# Patient Record
Sex: Female | Born: 1985 | Hispanic: Yes | Marital: Married | Smoking: Never smoker
Health system: Southern US, Community
[De-identification: ages and names within clinical notes are randomized; demographics above are authoritative.]

## PROBLEM LIST (undated history)

## (undated) DIAGNOSIS — D649 Anemia, unspecified: Secondary | ICD-10-CM

## (undated) HISTORY — PX: OOPHORECTOMY: SHX86

---

## 2009-04-25 ENCOUNTER — Inpatient Hospital Stay (HOSPITAL_COMMUNITY): Admission: AD | Admit: 2009-04-25 | Discharge: 2009-04-25 | Payer: Self-pay | Admitting: Obstetrics & Gynecology

## 2009-04-28 ENCOUNTER — Ambulatory Visit (HOSPITAL_COMMUNITY): Admission: RE | Admit: 2009-04-28 | Discharge: 2009-04-28 | Payer: Self-pay | Admitting: Obstetrics & Gynecology

## 2009-04-28 HISTORY — PX: DILATION AND CURETTAGE OF UTERUS: SHX78

## 2009-12-12 ENCOUNTER — Ambulatory Visit (HOSPITAL_COMMUNITY)
Admission: RE | Admit: 2009-12-12 | Discharge: 2009-12-12 | Payer: Self-pay | Source: Home / Self Care | Admitting: Obstetrics

## 2010-01-21 NOTE — L&D Delivery Note (Signed)
Delivery Note At 1:00 PM a viable female was delivered via Vaginal, Spontaneous Delivery (vertex - : ;  ).  APGAR: 8, 9; weight 8 lb 2.9 oz (3711 g).   Placenta status: Intact, Spontaneous.  Cord: 3 vessels with the following complications: .  Cord pH: none  Anesthesia: Epidural Local :  1% Xylocaine Episiotomy: Yes Lacerations: 2nd degree Suture Repair: 2.0 chromic Est. Blood Loss (mL): 350  Mom to postpartum.  Baby to nursery-stable.  Janiyha Montufar A 09/01/2010, 1:40 PM

## 2010-02-28 LAB — HIV ANTIBODY (ROUTINE TESTING W REFLEX): HIV: NONREACTIVE

## 2010-02-28 LAB — ABO/RH: RH Type: POSITIVE

## 2010-04-11 LAB — CBC
MCHC: 34.5 g/dL (ref 30.0–36.0)
Platelets: 147 10*3/uL — ABNORMAL LOW (ref 150–400)
RDW: 13.3 % (ref 11.5–15.5)

## 2010-04-11 LAB — ABO/RH: ABO/RH(D): A POS

## 2010-05-03 LAB — RPR: RPR: NONREACTIVE

## 2010-07-31 LAB — STREP B DNA PROBE: GBS: NEGATIVE

## 2010-08-20 ENCOUNTER — Encounter (HOSPITAL_COMMUNITY): Payer: Self-pay | Admitting: *Deleted

## 2010-08-20 ENCOUNTER — Inpatient Hospital Stay (HOSPITAL_COMMUNITY)
Admission: AD | Admit: 2010-08-20 | Discharge: 2010-08-20 | Disposition: A | Discharge status: Home / Self Care | Payer: Medicaid Other | Source: Ambulatory Visit | Attending: Obstetrics | Admitting: Obstetrics

## 2010-08-20 ENCOUNTER — Inpatient Hospital Stay (HOSPITAL_COMMUNITY): Payer: Medicaid Other

## 2010-08-20 DIAGNOSIS — O479 False labor, unspecified: Secondary | ICD-10-CM | POA: Insufficient documentation

## 2010-08-20 NOTE — Progress Notes (Signed)
Pt states ctx's began last pm, denies bleeding or leaking of fluid, has mucousy vaginal d/c x2 days. +Fm. Pt states frequent ctx's. Unsure of last cervical exam results.

## 2010-08-20 NOTE — ED Notes (Signed)
Pt given sprite, call light in reach.

## 2010-08-20 NOTE — Progress Notes (Signed)
Contracting all night, no bleeding or leaking. G 2 1 SAB

## 2010-08-27 ENCOUNTER — Inpatient Hospital Stay (HOSPITAL_COMMUNITY)
Admission: AD | Admit: 2010-08-27 | Discharge: 2010-08-28 | Disposition: A | Discharge status: Home / Self Care | Payer: Medicaid Other | Source: Ambulatory Visit | Attending: Obstetrics & Gynecology | Admitting: Obstetrics & Gynecology

## 2010-08-27 DIAGNOSIS — O479 False labor, unspecified: Secondary | ICD-10-CM | POA: Insufficient documentation

## 2010-08-28 NOTE — Discharge Instructions (Signed)
False Labor (Braxton Hicks Contractions) °Pregnancy is commonly associated with contractions of the uterus throughout the pregnancy. Towards the end of pregnancy (32-34 weeks), these contractions (Braxton Hicks) can develop more often and may become more forceful. This is not true labor because these contractions do not result in opening (dilatation) and thinning of the cervix. They are sometimes difficult to tell apart from true labor because these contractions can be forceful and people have different pain tolerances. You should not feel embarrassed if you go to the hospital with false labor. Sometimes, the only way to tell if you are in true labor is for your caregiver to follow the changes in the cervix. °HOW TO TELL THE DIFFERENCE BETWEEN TRUE AND FALSE LABOR °· False labor.  °· The contractions of false labor are usually shorter, irregular and not as hard as those of true labor.  °· They are often felt in the front of the lower abdomen and in the groin.  °· They may leave with walking around or changing positions while lying down.  °· They get weaker and are shorter lasting as time goes on.  °· These contractions are usually irregular.  °· They do not usually become progressively stronger, regular and closer together as with true labor.  °· True labor.  °· Contractions in true labor last 30 to 70 seconds, become very regular, usually become more intense, and increase in frequency.  °· They do not go away with walking.  °· The discomfort is usually felt in the top of the uterus and spreads to the lower abdomen and low back.  °· True labor can be determined by your caregiver with an exam. This will show that the cervix is dilating and getting thinner.  °If there are no prenatal problems or other health problems associated with the pregnancy, it is completely safe to be sent home with false labor and await the onset of true labor. °HOME CARE INSTRUCTIONS °· Keep up with your usual exercises and instructions.   °· Take medications as directed.  °· Keep your regular prenatal appointment.  °· Eat and drink lightly if you think you are going into labor.  °· If BH contractions are making you uncomfortable:  °· Change your activity position from lying down or resting to walking/walking to resting.  °· Sit and rest in a tub of warm water.  °· Drink 2 to 3 glasses of water. Dehydration may cause B-H contractions.  °· Do slow and deep breathing several times an hour.  °SEEK IMMEDIATE MEDICAL CARE IF: °· Your contractions continue to become stronger, more regular, and closer together.  °· You have a gushing, burst or leaking of fluid from the vagina.  °· An oral temperature above 100.4 develops.  °· You have passage of blood-tinged mucus.  °· You develop vaginal bleeding.  °· You develop continuous belly (abdominal) pain.  °· You have low back pain that you never had before.  °· You feel the baby’s head pushing down causing pelvic pressure.  °· The baby is not moving as much as it used to.  °Document Released: 01/07/2005 Document Re-Released: 06/27/2009 °Exit[employer]® Patient Information ©2011 [employer]. °

## 2010-08-28 NOTE — Progress Notes (Cosign Needed)
Pt G2 P0 at 40.2wks, having contractions x 1 hr and back pain all day.  Denies problems with pregnancy.

## 2010-08-28 NOTE — Progress Notes (Signed)
Pt presents for a labor check-FOB supportive at  [employer] speaking-Spanish intrep. present

## 2010-08-31 ENCOUNTER — Inpatient Hospital Stay (HOSPITAL_COMMUNITY)
Admission: RE | Admit: 2010-08-31 | Discharge: 2010-09-03 | DRG: 775 | Disposition: A | Discharge status: Home / Self Care | Payer: Medicaid Other | Source: Ambulatory Visit | Attending: Obstetrics | Admitting: Obstetrics

## 2010-08-31 DIAGNOSIS — O480 Post-term pregnancy: Secondary | ICD-10-CM | POA: Diagnosis present

## 2010-08-31 DIAGNOSIS — Z3490 Encounter for supervision of normal pregnancy, unspecified, unspecified trimester: Secondary | ICD-10-CM

## 2010-08-31 LAB — CBC
MCH: 30.1 pg (ref 26.0–34.0)
MCHC: 33.6 g/dL (ref 30.0–36.0)
Platelets: 144 10*3/uL — ABNORMAL LOW (ref 150–400)

## 2010-08-31 LAB — RPR: RPR Ser Ql: NONREACTIVE

## 2010-08-31 MED ORDER — HYDROXYZINE PAMOATE 50 MG PO CAPS
50.0000 mg | ORAL_CAPSULE | Freq: Four times a day (QID) | ORAL | Status: DC | PRN
  Filled 2010-08-31: qty 1

## 2010-08-31 MED ORDER — LACTATED RINGERS IV SOLN: 500.0000 mL | INTRAVENOUS | Status: DC | PRN

## 2010-08-31 MED ORDER — PHENYLEPHRINE 40 MCG/ML (10ML) SYRINGE FOR IV PUSH (FOR BLOOD PRESSURE SUPPORT)
80.0000 ug | PREFILLED_SYRINGE | INTRAVENOUS | Status: DC | PRN
  Filled 2010-08-31 (×2): qty 5

## 2010-08-31 MED ORDER — FLEET ENEMA 7-19 GM/118ML RE ENEM: 1.0000 | ENEMA | RECTAL | Status: DC | PRN

## 2010-08-31 MED ORDER — DIPHENHYDRAMINE HCL 50 MG/ML IJ SOLN: 12.5000 mg | INTRAMUSCULAR | Status: DC | PRN

## 2010-08-31 MED ORDER — LIDOCAINE HCL (PF) 1 % IJ SOLN
30.0000 mL | INTRAMUSCULAR | Status: AC | PRN
  Administered 2010-09-01: 30 mL via SUBCUTANEOUS
  Filled 2010-08-31: qty 30

## 2010-08-31 MED ORDER — LACTATED RINGERS IV SOLN
INTRAVENOUS | Status: DC
  Administered 2010-08-31 – 2010-09-01 (×3): via INTRAVENOUS

## 2010-08-31 MED ORDER — NALBUPHINE SYRINGE 5 MG/0.5 ML
10.0000 mg | INJECTION | Freq: Four times a day (QID) | INTRAMUSCULAR | Status: DC | PRN
  Administered 2010-08-31: 10 mg via INTRAVENOUS
  Filled 2010-08-31 (×2): qty 1

## 2010-08-31 MED ORDER — EPHEDRINE 5 MG/ML INJ
10.0000 mg | INTRAVENOUS | Status: DC | PRN
  Filled 2010-08-31 (×2): qty 4

## 2010-08-31 MED ORDER — ONDANSETRON HCL 4 MG/2ML IJ SOLN
4.0000 mg | Freq: Four times a day (QID) | INTRAMUSCULAR | Status: DC | PRN
  Administered 2010-08-31: 4 mg via INTRAVENOUS
  Filled 2010-08-31: qty 2

## 2010-08-31 MED ORDER — IBUPROFEN 600 MG PO TABS: 600.0000 mg | ORAL_TABLET | Freq: Four times a day (QID) | ORAL | Status: DC | PRN

## 2010-08-31 MED ORDER — ACETAMINOPHEN 325 MG PO TABS: 650.0000 mg | ORAL_TABLET | ORAL | Status: DC | PRN

## 2010-08-31 MED ORDER — LACTATED RINGERS IV SOLN
500.0000 mL | Freq: Once | INTRAVENOUS | Status: AC
  Administered 2010-08-31: 500 mL via INTRAVENOUS

## 2010-08-31 MED ORDER — OXYTOCIN BOLUS FROM INFUSION
500.0000 mL | Freq: Once | INTRAVENOUS | Status: DC
  Filled 2010-08-31: qty 500

## 2010-08-31 MED ORDER — OXYTOCIN 20 UNITS IN LACTATED RINGERS INFUSION - SIMPLE
125.0000 mL/h | INTRAVENOUS | Status: AC
  Filled 2010-08-31: qty 1000

## 2010-08-31 MED ORDER — EPHEDRINE 5 MG/ML INJ
10.0000 mg | INTRAVENOUS | Status: DC | PRN
  Filled 2010-08-31: qty 4

## 2010-08-31 MED ORDER — FENTANYL 2.5 MCG/ML BUPIVACAINE 1/10 % EPIDURAL INFUSION (WH - ANES)
14.0000 mL/h | INTRAMUSCULAR | Status: DC
  Administered 2010-09-01 (×4): 14 mL/h via EPIDURAL
  Filled 2010-08-31 (×4): qty 60

## 2010-08-31 MED ORDER — OXYCODONE-ACETAMINOPHEN 5-325 MG PO TABS: 2.0000 | ORAL_TABLET | ORAL | Status: DC | PRN

## 2010-08-31 MED ORDER — CITRIC ACID-SODIUM CITRATE 334-500 MG/5ML PO SOLN: 30.0000 mL | ORAL | Status: DC | PRN

## 2010-08-31 MED ORDER — PHENYLEPHRINE 40 MCG/ML (10ML) SYRINGE FOR IV PUSH (FOR BLOOD PRESSURE SUPPORT)
80.0000 ug | PREFILLED_SYRINGE | INTRAVENOUS | Status: DC | PRN
Start: 2010-08-31 — End: 2010-09-01
  Filled 2010-08-31: qty 5

## 2010-08-31 NOTE — Progress Notes (Signed)
Dr Jean Rosenthal notified regarding patient request for epidural. Informed of PLT 144 @ 0700 08/31/10. Stated CBC did not need to be redrawn. LR bolus started.

## 2010-08-31 NOTE — Progress Notes (Signed)
08/31/2010 Angela Herring  Interpreter  I assisted Debbie RN with questions

## 2010-08-31 NOTE — Anesthesia Preprocedure Evaluation (Addendum)

## 2010-08-31 NOTE — H&P (Signed)
Angela Herring is a 25 y.o. female presenting for IOL. Maternal Medical History:  Reason for admission: Postdates  Contractions: Onset was 13-24 hours ago.   Frequency: regular.   Duration is approximately 1 minute.   Perceived severity is mild.    Fetal activity: Perceived fetal activity is normal.   Last perceived fetal movement was within the past hour.    Prenatal complications: no prenatal complications   OB History    Grav Para Term Preterm Abortions TAB SAB Ect Mult Living   2    1  1         Past Medical History  Diagnosis Date  . Miscarriage    Past Surgical History  Procedure Date  . Dilation and curettage of uterus   . Tumor removal    Family History: family history is not on file. Social History:  reports that she has never smoked. She has never used smokeless tobacco. She reports that she does not drink alcohol or use illicit drugs.  Review of Systems  All other systems reviewed and are negative.    Dilation: Fingertip Station: -3;-2 Exam by:: Lorretta Harp RNC Blood pressure 109/80, pulse 76, temperature 98.5 F (36.9 C), temperature source Oral, resp. rate 18, height 5\' 2"  (1.575 m), weight 74.39 kg (164 lb). Maternal Exam:  Uterine Assessment: Contraction strength is mild.  Contraction duration is 1 minute. Contraction frequency is regular.   Abdomen: Patient reports no abdominal tenderness. Fetal presentation: vertex  Introitus: Normal vulva. Normal vagina.  Pelvis: adequate for delivery.   Cervix: Cervix evaluated by digital exam.     Physical Exam  Nursing note and vitals reviewed. Constitutional: She is oriented to person, place, and time. She appears well-developed and well-nourished.  HENT:  Head: Normocephalic and atraumatic.  Eyes: Conjunctivae and EOM are normal. Pupils are equal, round, and reactive to light.  Neck: Normal range of motion. Neck supple.  Cardiovascular: Normal rate and regular rhythm.   Respiratory: Effort normal.  GI:  Soft.  Genitourinary: Vagina normal and uterus normal.  Musculoskeletal: Normal range of motion.  Neurological: She is alert and oriented to person, place, and time. She has normal reflexes.  Skin: Skin is warm and dry.  Psychiatric: She has a normal mood and affect. Her behavior is normal. Judgment and thought content normal.    Prenatal labs: ABO, Rh:   Antibody:   Rubella:   RPR: Nonreactive (04/12 0000)  HBsAg: Negative (02/08 0000)  HIV: Non-reactive (02/08 0000)  GBS:     Assessment/Plan: IOL Postdates.  Foley bulb cervical ripening, then pitocin.  Tylin Force A 08/31/2010, 9:11 AM

## 2010-08-31 NOTE — Plan of Care (Signed)
Problem: Consults Goal: Birthing Suites Patient Information Press F2 to bring up selections list  Outcome: Completed/Met Date Met:  08/31/10  Pt > [redacted] weeks EGA

## 2010-08-31 NOTE — Progress Notes (Signed)
Angela Herring is a 25 y.o. G2P0010 at [redacted]w[redacted]d by LMP admitted for induction of labor due to postdates..  Subjective:   Objective: BP 109/80  Pulse 76  Temp(Src) 98.5 F (36.9 C) (Oral)  Resp 18  Ht 5\' 2"  (1.575 m)  Wt 74.39 kg (164 lb)  BMI 30.00 kg/m2      FHT:  FHR: 150 bpm, variability: moderate,  accelerations:  Present,  decelerations:  Absent UC:   regular, every 4-7 minutes SVE:   Dilation: Fingertip Station: -3;-2 Exam by:: Lorretta Harp RNC  Labs: Lab Results  Component Value Date   WBC 7.3 04/28/2009   HGB 11.0* 04/28/2009   HCT 31.9* 04/28/2009   MCV 94.2 04/28/2009   PLT 147* 04/28/2009    Assessment / Plan: Induction of labor due to postterm,  progressing well on pitocin  Labor: foley bulb cervical ripening, then pitocin. Preeclampsia:  n/a Fetal Wellbeing:  Category I Pain Control:  Nubain/Epidural in active labor. I/D:  n/a Anticipated MOD:  NSVD  Kaysi Ourada A 08/31/2010, 9:36 AM

## 2010-08-31 NOTE — Progress Notes (Signed)
08/31/2010 Angela Herring  Interpreter  I assisted Dr. Clearance Coots with explanation of plan of care.

## 2010-09-01 ENCOUNTER — Inpatient Hospital Stay (HOSPITAL_COMMUNITY): Payer: Medicaid Other | Admitting: Anesthesiology

## 2010-09-01 ENCOUNTER — Encounter (HOSPITAL_COMMUNITY): Payer: Self-pay

## 2010-09-01 ENCOUNTER — Encounter (HOSPITAL_COMMUNITY): Payer: Self-pay | Admitting: Anesthesiology

## 2010-09-01 MED ORDER — SIMETHICONE 80 MG PO CHEW: 80.0000 mg | CHEWABLE_TABLET | ORAL | Status: DC | PRN

## 2010-09-01 MED ORDER — OXYTOCIN 20 UNITS IN LACTATED RINGERS INFUSION - SIMPLE
1.0000 mL/h | Freq: Once | INTRAVENOUS | Status: AC
  Administered 2010-09-01: 999 mL/h via INTRAVENOUS

## 2010-09-01 MED ORDER — MEDROXYPROGESTERONE ACETATE 150 MG/ML IM SUSP: 150.0000 mg | INTRAMUSCULAR | Status: DC | PRN

## 2010-09-01 MED ORDER — TERBUTALINE SULFATE 1 MG/ML IJ SOLN: 0.2500 mg | Freq: Once | INTRAMUSCULAR | Status: DC | PRN

## 2010-09-01 MED ORDER — IBUPROFEN 600 MG PO TABS
600.0000 mg | ORAL_TABLET | Freq: Four times a day (QID) | ORAL | Status: DC
  Administered 2010-09-01 – 2010-09-03 (×8): 600 mg via ORAL
  Filled 2010-09-01 (×7): qty 1

## 2010-09-01 MED ORDER — OXYTOCIN 20 UNITS IN LACTATED RINGERS INFUSION - SIMPLE
1.0000 m[IU]/min | INTRAVENOUS | Status: DC
  Administered 2010-09-01: 1 m[IU]/min via INTRAVENOUS

## 2010-09-01 MED ORDER — METHYLERGONOVINE MALEATE 0.2 MG PO TABS: 0.2000 mg | ORAL_TABLET | ORAL | Status: DC | PRN

## 2010-09-01 MED ORDER — ZOLPIDEM TARTRATE 5 MG PO TABS: 5.0000 mg | ORAL_TABLET | Freq: Every evening | ORAL | Status: DC | PRN

## 2010-09-01 MED ORDER — LANOLIN HYDROUS EX OINT: TOPICAL_OINTMENT | CUTANEOUS | Status: DC | PRN

## 2010-09-01 MED ORDER — WITCH HAZEL-GLYCERIN EX PADS: 1.0000 "application " | MEDICATED_PAD | CUTANEOUS | Status: DC | PRN

## 2010-09-01 MED ORDER — PRENATAL PLUS 27-1 MG PO TABS
1.0000 | ORAL_TABLET | Freq: Every day | ORAL | Status: DC
  Administered 2010-09-01 – 2010-09-03 (×3): 1 via ORAL
  Filled 2010-09-01 (×3): qty 1

## 2010-09-01 MED ORDER — ONDANSETRON HCL 4 MG/2ML IJ SOLN: 4.0000 mg | INTRAMUSCULAR | Status: DC | PRN

## 2010-09-01 MED ORDER — ONDANSETRON HCL 4 MG PO TABS: 4.0000 mg | ORAL_TABLET | ORAL | Status: DC | PRN

## 2010-09-01 MED ORDER — MEASLES, MUMPS & RUBELLA VAC ~~LOC~~ INJ
0.5000 mL | INJECTION | Freq: Once | SUBCUTANEOUS | Status: DC
  Filled 2010-09-01: qty 0.5

## 2010-09-01 MED ORDER — OXYTOCIN 20 UNITS IN LACTATED RINGERS INFUSION - SIMPLE: 125.0000 mL/h | INTRAVENOUS | Status: DC | PRN

## 2010-09-01 MED ORDER — OXYCODONE-ACETAMINOPHEN 5-325 MG PO TABS: 1.0000 | ORAL_TABLET | ORAL | Status: DC | PRN

## 2010-09-01 MED ORDER — METHYLERGONOVINE MALEATE 0.2 MG/ML IJ SOLN: 0.2000 mg | INTRAMUSCULAR | Status: DC | PRN

## 2010-09-01 MED ORDER — SENNOSIDES-DOCUSATE SODIUM 8.6-50 MG PO TABS
2.0000 | ORAL_TABLET | Freq: Every day | ORAL | Status: DC
  Administered 2010-09-01 – 2010-09-02 (×2): 2 via ORAL

## 2010-09-01 MED ORDER — DIBUCAINE 1 % RE OINT: 1.0000 "application " | TOPICAL_OINTMENT | RECTAL | Status: DC | PRN

## 2010-09-01 MED ORDER — OXYTOCIN 10 UNIT/ML IJ SOLN
20.0000 [IU] | INTRAVENOUS | Status: DC
  Filled 2010-09-01: qty 2

## 2010-09-01 MED ORDER — LIDOCAINE HCL 1.5 % IJ SOLN
INTRAMUSCULAR | Status: DC | PRN
  Administered 2010-09-01 (×2): 5 mL via EPIDURAL

## 2010-09-01 MED ORDER — DIPHENHYDRAMINE HCL 25 MG PO CAPS: 25.0000 mg | ORAL_CAPSULE | Freq: Four times a day (QID) | ORAL | Status: DC | PRN

## 2010-09-01 MED ORDER — BENZOCAINE-MENTHOL 20-0.5 % EX AERO
1.0000 "application " | INHALATION_SPRAY | CUTANEOUS | Status: DC | PRN
  Administered 2010-09-02: 1 via TOPICAL

## 2010-09-01 MED ORDER — TETANUS-DIPHTH-ACELL PERTUSSIS 5-2.5-18.5 LF-MCG/0.5 IM SUSP: 0.5000 mL | Freq: Once | INTRAMUSCULAR | Status: DC

## 2010-09-01 NOTE — Progress Notes (Signed)
Angela Herring is Herring 25 y.o. G2P0010 at [redacted]w[redacted]d by LMP admitted for induction of labor due to Post dates. Due date 08-27-10..  Subjective:   Objective: BP 119/74  Pulse 63  Temp(Src) 98.3 F (36.8 C) (Oral)  Resp 18  Ht 5\' 2"  (1.575 m)  Wt 74.39 kg (164 lb)  BMI 30.00 kg/m2  SpO2 98%      FHT:  FHR: 140 bpm, variability: moderate,  accelerations:  Present,  decelerations:  Absent UC:   regular, every 5 minutes SVE:   Dilation: 10 Effacement (%): 100 Station: +1 Exam by:: Ace Gins, RN  Labs: Lab Results  Component Value Date   WBC 8.2 08/31/2010   HGB 11.0* 08/31/2010   HCT 32.7* 08/31/2010   MCV 89.6 08/31/2010   PLT 144* 08/31/2010    Assessment / Plan: Spontaneous labor, progressing normally  Labor: Progressing normally Preeclampsia:  n/Herring Fetal Wellbeing:  Category I Pain Control:  Epidural I/D:  n/Herring Anticipated MOD:  NSVD  Angela Herring 09/01/2010, 9:53 AM

## 2010-09-01 NOTE — Progress Notes (Signed)
Dr Clearance Coots called unit to get update on patient. Informed of contraction pattern, FHR, SVE and epidural placement.  Order received to start patient on Pitocin.

## 2010-09-01 NOTE — Progress Notes (Signed)
Dr Clearance Coots notified regarding SVE. Stated to let patient labor down and he was 15 minutes away.  Will continue to monitor.

## 2010-09-01 NOTE — Anesthesia Procedure Notes (Signed)
Epidural Patient location during procedure: OB Start time: 09/01/2010 12:17 AM  Staffing Performed by: anesthesiologist   Preanesthetic Checklist Completed: patient identified, site marked, surgical consent, pre-op evaluation, timeout performed, IV checked, risks and benefits discussed and monitors and equipment checked  Epidural Patient position: sitting Prep: site prepped and draped and DuraPrep Patient monitoring: continuous pulse ox and blood pressure Approach: midline Injection technique: LOR air and LOR saline  Needle:  Needle type: Tuohy  Needle gauge: 17 G Needle length: 9 cm Needle insertion depth: 5 cm cm Catheter type: closed end flexible Catheter size: 19 Gauge Catheter at skin depth: 10 cm Test dose: negative  Assessment Events: blood not aspirated, injection not painful, no injection resistance, negative IV test and no paresthesia  Additional Notes Patient identified.  Risk benefits discussed including failed block, incomplete pain control, headache, nerve damage, paralysis, blood pressure changes, nausea, vomiting, reactions to medication both toxic or allergic, and postpartum back pain.  Patient expressed understanding and wished to proceed.  All questions were answered.  Sterile technique used throughout procedure and epidural site dressed with sterile barrier dressing. No paresthesia or other complications noted.The patient did not experience any signs of intravascular injection such as tinnitus or metallic taste in mouth nor signs of intrathecal spread such as rapid motor block. Please see nursing notes for vital signs.

## 2010-09-02 LAB — CBC
HCT: 26.6 % — ABNORMAL LOW (ref 36.0–46.0)
Hemoglobin: 8.9 g/dL — ABNORMAL LOW (ref 12.0–15.0)
MCH: 30.2 pg (ref 26.0–34.0)
MCV: 90.2 fL (ref 78.0–100.0)
RBC: 2.95 MIL/uL — ABNORMAL LOW (ref 3.87–5.11)

## 2010-09-02 MED ORDER — BENZOCAINE-MENTHOL 20-0.5 % EX AERO
INHALATION_SPRAY | CUTANEOUS | Status: AC
  Administered 2010-09-02: 1 via TOPICAL
  Filled 2010-09-02: qty 56

## 2010-09-02 NOTE — Progress Notes (Signed)
Post Partum Day 1 Subjective: no complaints, up ad lib, voiding, tolerating PO and + flatus  Objective: Blood pressure 98/60, pulse 77, temperature 98.3 F (36.8 C), temperature source Oral, resp. rate 18, height 5\' 2"  (1.575 m), weight 74.39 kg (164 lb), SpO2 94.00%, unknown if currently breastfeeding.  Physical Exam:  General: alert and no distress Lochia: appropriate Uterine Fundus: firm Incision: healing well DVT Evaluation: No evidence of DVT seen on physical exam.   Basename 09/02/10 0536 08/31/10 0700  HGB 8.9* 11.0*  HCT 26.6* 32.7*    Assessment/Plan: Plan for discharge tomorrow   LOS: 2 days   HARPER,CHARLES A 09/02/2010, 6:58 AM

## 2010-09-02 NOTE — Anesthesia Postprocedure Evaluation (Signed)
Anesthesia Post Note  Patient: Angela Herring  Procedure(s) Performed: * No procedures listed *  Anesthesia type: Epidural  Patient location: Mother/Baby  Post pain: Pain level controlled  Post assessment: Post-op Vital signs reviewed  Last Vitals:  Filed Vitals:   09/02/10 0400  BP: 98/60  Pulse: 77  Temp: 98.3 F (36.8 C)  Resp: 18    Post vital signs: Reviewed  Level of consciousness: awake  Complications: No apparent anesthesia complications

## 2010-09-03 MED ORDER — IBUPROFEN 600 MG PO TABS: 600.0000 mg | ORAL_TABLET | Freq: Four times a day (QID) | ORAL | Status: AC

## 2010-09-03 NOTE — Discharge Summary (Signed)
Obstetric Discharge Summary Reason for Admission: induction of labor Prenatal Procedures: ultrasound Intrapartum Procedures: spontaneous vaginal delivery Postpartum Procedures: none Complications-Operative and Postpartum: 2nd degree perineal laceration Hemoglobin  Date Value Range Status  09/02/2010 8.9* 12.0-15.0 (g/dL) Final     DELTA CHECK NOTED     REPEATED TO VERIFY     HCT  Date Value Range Status  09/02/2010 26.6* 36.0-46.0 (%) Final    Discharge Diagnoses: Term Pregnancy-delivered  Discharge Information: Date: 09/03/2010 Activity: pelvic rest Diet: routine Medications: PNV, Ibuprophen and Colace Condition: stable Instructions: refer to practice specific booklet Discharge to: home Follow-up Information    Follow up with Yaniel Limbaugh A, MD. Make an appointment in 6 weeks.   Contact information:   [address] [place] [address] [phone]          Newborn Data: Live born female  Birth Weight: 8 lb 2.9 oz (3711 g) APGAR: 8, 9  Home with mother.  Hasten Sweitzer A 09/03/2010, 12:54 PM

## 2010-09-03 NOTE — Progress Notes (Signed)
UR chart review completed.  

## 2010-09-03 NOTE — Progress Notes (Signed)
Post Partum Day 2 Subjective: no complaints, up ad lib, voiding, tolerating PO and + flatus  Objective: Blood pressure 100/60, pulse 76, temperature 98.5 F (36.9 C), temperature source Oral, resp. rate 18, height 5\' 2"  (1.575 m), weight 74.39 kg (164 lb), SpO2 94.00%, unknown if currently breastfeeding.  Physical Exam:  General: alert and no distress Lochia: appropriate Uterine Fundus: firm Incision: healing well DVT Evaluation: No evidence of DVT seen on physical exam.   Basename 09/02/10 0536  HGB 8.9*  HCT 26.6*    Assessment/Plan: Discharge home   LOS: 3 days   HARPER,CHARLES A 09/03/2010, 12:53 PM

## 2010-09-03 NOTE — Progress Notes (Signed)

## 2010-09-03 NOTE — Progress Notes (Signed)
  Was postop C-section postpartum postop day 1 No complaints afebrile vital signs normal Breasts soft Abdomen nontender uterus firm Lochia moderate Legs negative assessment plan Assessment plan doing well discharge.

## 2010-10-16 ENCOUNTER — Inpatient Hospital Stay (INDEPENDENT_AMBULATORY_CARE_PROVIDER_SITE_OTHER)
Admission: RE | Admit: 2010-10-16 | Discharge: 2010-10-16 | Disposition: A | Discharge status: Home / Self Care | Payer: Medicaid Other | Source: Ambulatory Visit | Attending: Family Medicine | Admitting: Family Medicine

## 2010-10-16 DIAGNOSIS — R509 Fever, unspecified: Secondary | ICD-10-CM

## 2011-01-22 NOTE — L&D Delivery Note (Signed)
Delivery Note At 3:34 PM a viable female was delivered via Vaginal, Spontaneous Delivery (Presentation: Left Occiput Anterior).  APGAR: 9, 9; weight .   Placenta status: Intact, Spontaneous.  Cord: 3 vessels with the following complications: None.  Cord pH: not done  Anesthesia: Epidural  Episiotomy: None Lacerations: 2nd degree Suture Repair: 2.0 Est. Blood Loss (mL): 300  Mom to postpartum.  Baby to nursery-stable.  MARSHALL,BERNARD A 11/02/2011, 4:06 PM

## 2011-05-06 LAB — OB RESULTS CONSOLE HIV ANTIBODY (ROUTINE TESTING): HIV: NONREACTIVE

## 2011-05-06 LAB — OB RESULTS CONSOLE GC/CHLAMYDIA: Gonorrhea: NEGATIVE

## 2011-10-29 ENCOUNTER — Inpatient Hospital Stay (HOSPITAL_COMMUNITY)
Admission: AD | Admit: 2011-10-29 | Discharge: 2011-10-29 | Disposition: A | Discharge status: Home / Self Care | Payer: Medicaid Other | Source: Ambulatory Visit | Attending: Obstetrics | Admitting: Obstetrics

## 2011-10-29 ENCOUNTER — Encounter (HOSPITAL_COMMUNITY): Payer: Self-pay | Admitting: *Deleted

## 2011-10-29 DIAGNOSIS — O479 False labor, unspecified: Secondary | ICD-10-CM | POA: Insufficient documentation

## 2011-10-29 NOTE — MAU Note (Signed)
Pt G2 P1 at 40.2wks having contractions.  No bleeding or leaking.  Passed mucous this evening.  No problems with pregnancy.

## 2011-10-29 NOTE — MAU Note (Signed)
Dr. Gaynell Face notified of pt.  Order rec'd for discharge.

## 2011-11-01 ENCOUNTER — Encounter (HOSPITAL_COMMUNITY): Payer: Self-pay

## 2011-11-01 ENCOUNTER — Inpatient Hospital Stay (HOSPITAL_COMMUNITY)
Admission: AD | Admit: 2011-11-01 | Discharge: 2011-11-02 | Disposition: A | Discharge status: Home / Self Care | Payer: Medicaid Other | Source: Ambulatory Visit | Attending: Obstetrics & Gynecology | Admitting: Obstetrics & Gynecology

## 2011-11-01 DIAGNOSIS — O479 False labor, unspecified: Secondary | ICD-10-CM | POA: Insufficient documentation

## 2011-11-01 NOTE — MAU Note (Signed)
I've had contractions all wk but since this morning they haven't gone away. Ctxs not that close but had some mucousy d/c with blood in it

## 2011-11-02 ENCOUNTER — Encounter (HOSPITAL_COMMUNITY): Payer: Self-pay | Admitting: Anesthesiology

## 2011-11-02 ENCOUNTER — Inpatient Hospital Stay (HOSPITAL_COMMUNITY): Payer: Medicaid Other | Admitting: Anesthesiology

## 2011-11-02 ENCOUNTER — Encounter (HOSPITAL_COMMUNITY): Payer: Self-pay | Admitting: Obstetrics and Gynecology

## 2011-11-02 ENCOUNTER — Inpatient Hospital Stay (HOSPITAL_COMMUNITY)
Admission: AD | Admit: 2011-11-02 | Discharge: 2011-11-04 | DRG: 775 | Disposition: A | Discharge status: Home / Self Care | Payer: Medicaid Other | Source: Ambulatory Visit | Attending: Obstetrics | Admitting: Obstetrics

## 2011-11-02 DIAGNOSIS — O480 Post-term pregnancy: Secondary | ICD-10-CM

## 2011-11-02 LAB — CBC
HCT: 31.7 % — ABNORMAL LOW (ref 36.0–46.0)
MCHC: 31.9 g/dL (ref 30.0–36.0)
MCV: 80.1 fL (ref 78.0–100.0)
RDW: 15.1 % (ref 11.5–15.5)
WBC: 10.3 10*3/uL (ref 4.0–10.5)

## 2011-11-02 LAB — TYPE AND SCREEN: Antibody Screen: NEGATIVE

## 2011-11-02 MED ORDER — OXYCODONE-ACETAMINOPHEN 5-325 MG PO TABS: 1.0000 | ORAL_TABLET | ORAL | Status: DC | PRN

## 2011-11-02 MED ORDER — OXYTOCIN BOLUS FROM INFUSION
500.0000 mL | Freq: Once | INTRAVENOUS | Status: DC
  Filled 2011-11-02: qty 500

## 2011-11-02 MED ORDER — LACTATED RINGERS IV SOLN
500.0000 mL | Freq: Once | INTRAVENOUS | Status: AC
  Administered 2011-11-02: 500 mL via INTRAVENOUS

## 2011-11-02 MED ORDER — DIBUCAINE 1 % RE OINT: 1.0000 "application " | TOPICAL_OINTMENT | RECTAL | Status: DC | PRN

## 2011-11-02 MED ORDER — IBUPROFEN 600 MG PO TABS
600.0000 mg | ORAL_TABLET | Freq: Four times a day (QID) | ORAL | Status: DC
  Administered 2011-11-02 – 2011-11-04 (×8): 600 mg via ORAL
  Filled 2011-11-02 (×4): qty 1

## 2011-11-02 MED ORDER — ZOLPIDEM TARTRATE 5 MG PO TABS: 5.0000 mg | ORAL_TABLET | Freq: Every evening | ORAL | Status: DC | PRN

## 2011-11-02 MED ORDER — FLEET ENEMA 7-19 GM/118ML RE ENEM: 1.0000 | ENEMA | RECTAL | Status: DC | PRN

## 2011-11-02 MED ORDER — PHENYLEPHRINE 40 MCG/ML (10ML) SYRINGE FOR IV PUSH (FOR BLOOD PRESSURE SUPPORT)
80.0000 ug | PREFILLED_SYRINGE | INTRAVENOUS | Status: DC | PRN
  Administered 2011-11-02: 80 ug via INTRAVENOUS

## 2011-11-02 MED ORDER — FERROUS SULFATE 325 (65 FE) MG PO TABS
325.0000 mg | ORAL_TABLET | Freq: Two times a day (BID) | ORAL | Status: DC
  Administered 2011-11-03 – 2011-11-04 (×3): 325 mg via ORAL
  Filled 2011-11-02 (×3): qty 1

## 2011-11-02 MED ORDER — DIPHENHYDRAMINE HCL 25 MG PO CAPS: 25.0000 mg | ORAL_CAPSULE | Freq: Four times a day (QID) | ORAL | Status: DC | PRN

## 2011-11-02 MED ORDER — LACTATED RINGERS IV SOLN: INTRAVENOUS | Status: DC

## 2011-11-02 MED ORDER — LACTATED RINGERS IV SOLN
500.0000 mL | INTRAVENOUS | Status: DC | PRN
  Administered 2011-11-02: 500 mL via INTRAVENOUS

## 2011-11-02 MED ORDER — ACETAMINOPHEN 325 MG PO TABS: 650.0000 mg | ORAL_TABLET | ORAL | Status: DC | PRN

## 2011-11-02 MED ORDER — LANOLIN HYDROUS EX OINT: TOPICAL_OINTMENT | CUTANEOUS | Status: DC | PRN

## 2011-11-02 MED ORDER — PHENYLEPHRINE 40 MCG/ML (10ML) SYRINGE FOR IV PUSH (FOR BLOOD PRESSURE SUPPORT)
80.0000 ug | PREFILLED_SYRINGE | INTRAVENOUS | Status: DC | PRN
  Filled 2011-11-02: qty 5

## 2011-11-02 MED ORDER — PRENATAL MULTIVITAMIN CH
1.0000 | ORAL_TABLET | Freq: Every day | ORAL | Status: DC
  Administered 2011-11-02 – 2011-11-04 (×3): 1 via ORAL
  Filled 2011-11-02 (×3): qty 1

## 2011-11-02 MED ORDER — ONDANSETRON HCL 4 MG PO TABS: 4.0000 mg | ORAL_TABLET | ORAL | Status: DC | PRN

## 2011-11-02 MED ORDER — SENNOSIDES-DOCUSATE SODIUM 8.6-50 MG PO TABS
2.0000 | ORAL_TABLET | Freq: Every day | ORAL | Status: DC
  Administered 2011-11-03 (×2): 2 via ORAL

## 2011-11-02 MED ORDER — WITCH HAZEL-GLYCERIN EX PADS: 1.0000 "application " | MEDICATED_PAD | CUTANEOUS | Status: DC | PRN

## 2011-11-02 MED ORDER — LIDOCAINE HCL (PF) 1 % IJ SOLN
INTRAMUSCULAR | Status: DC | PRN
  Administered 2011-11-02 (×3): 4 mL

## 2011-11-02 MED ORDER — OXYTOCIN 40 UNITS IN LACTATED RINGERS INFUSION - SIMPLE MED
62.5000 mL/h | Freq: Once | INTRAVENOUS | Status: AC
  Administered 2011-11-02: 62.5 mL/h via INTRAVENOUS
  Filled 2011-11-02: qty 1000

## 2011-11-02 MED ORDER — BENZOCAINE-MENTHOL 20-0.5 % EX AERO: 1.0000 "application " | INHALATION_SPRAY | CUTANEOUS | Status: DC | PRN

## 2011-11-02 MED ORDER — DIPHENHYDRAMINE HCL 50 MG/ML IJ SOLN: 12.5000 mg | INTRAMUSCULAR | Status: DC | PRN

## 2011-11-02 MED ORDER — ONDANSETRON HCL 4 MG/2ML IJ SOLN: 4.0000 mg | Freq: Four times a day (QID) | INTRAMUSCULAR | Status: DC | PRN

## 2011-11-02 MED ORDER — CITRIC ACID-SODIUM CITRATE 334-500 MG/5ML PO SOLN: 30.0000 mL | ORAL | Status: DC | PRN

## 2011-11-02 MED ORDER — EPHEDRINE 5 MG/ML INJ: 10.0000 mg | INTRAVENOUS | Status: DC | PRN

## 2011-11-02 MED ORDER — ONDANSETRON HCL 4 MG/2ML IJ SOLN: 4.0000 mg | INTRAMUSCULAR | Status: DC | PRN

## 2011-11-02 MED ORDER — FENTANYL 2.5 MCG/ML BUPIVACAINE 1/10 % EPIDURAL INFUSION (WH - ANES)
14.0000 mL/h | INTRAMUSCULAR | Status: DC
  Administered 2011-11-02: 14 mL/h via EPIDURAL
  Filled 2011-11-02: qty 125

## 2011-11-02 MED ORDER — LIDOCAINE HCL (PF) 1 % IJ SOLN
30.0000 mL | INTRAMUSCULAR | Status: DC | PRN
  Filled 2011-11-02: qty 30

## 2011-11-02 MED ORDER — EPHEDRINE 5 MG/ML INJ
10.0000 mg | INTRAVENOUS | Status: DC | PRN
  Filled 2011-11-02: qty 4

## 2011-11-02 MED ORDER — IBUPROFEN 600 MG PO TABS
600.0000 mg | ORAL_TABLET | Freq: Four times a day (QID) | ORAL | Status: DC | PRN
  Filled 2011-11-02 (×5): qty 1

## 2011-11-02 MED ORDER — SIMETHICONE 80 MG PO CHEW: 80.0000 mg | CHEWABLE_TABLET | ORAL | Status: DC | PRN

## 2011-11-02 MED ORDER — TETANUS-DIPHTH-ACELL PERTUSSIS 5-2.5-18.5 LF-MCG/0.5 IM SUSP
0.5000 mL | Freq: Once | INTRAMUSCULAR | Status: AC
  Administered 2011-11-03: 0.5 mL via INTRAMUSCULAR
  Filled 2011-11-02: qty 0.5

## 2011-11-02 MED ORDER — BUTORPHANOL TARTRATE 1 MG/ML IJ SOLN: 1.0000 mg | INTRAMUSCULAR | Status: DC | PRN

## 2011-11-02 NOTE — Anesthesia Procedure Notes (Signed)
Epidural Patient location during procedure: OB Start time: 11/02/2011 1:22 PM  Staffing Performed by: anesthesiologist   Preanesthetic Checklist Completed: patient identified, site marked, surgical consent, pre-op evaluation, timeout performed, IV checked, risks and benefits discussed and monitors and equipment checked  Epidural Patient position: sitting Prep: site prepped and draped and DuraPrep Patient monitoring: continuous pulse ox and blood pressure Approach: midline Injection technique: LOR air  Needle:  Needle type: Tuohy  Needle gauge: 17 G Needle length: 9 cm and 9 Needle insertion depth: 5 cm cm Catheter type: closed end flexible Catheter size: 19 Gauge Catheter at skin depth: 10 cm Test dose: negative  Assessment Events: blood not aspirated, injection not painful, no injection resistance, negative IV test and no paresthesia  Additional Notes Discussed risk of headache, infection, bleeding, nerve injury and failed or incomplete block.  Patient voices understanding and wishes to proceed. Reason for block:procedure for pain

## 2011-11-02 NOTE — Anesthesia Postprocedure Evaluation (Signed)
  Anesthesia Post-op Note  Patient: Angela Herring  Procedure(s) Performed: * No procedures listed *  Patient Location: PACU and Mother/Baby  Anesthesia Type: Epidural  Level of Consciousness: awake, alert  and oriented  Airway and Oxygen Therapy: Patient Spontanous Breathing  Post-op Pain: mild  Post-op Assessment: Post-op Vital signs reviewed  Post-op Vital Signs: Reviewed and stable  Complications: No apparent anesthesia complications

## 2011-11-02 NOTE — Anesthesia Preprocedure Evaluation (Signed)

## 2011-11-02 NOTE — MAU Note (Signed)
Notified Dr. Gaynell Face patient presented with some blood on tissue when wiping, contractions irregular 8-10 minutes apart, cervix 2/60/-2 intact, no blood noted on glove from exam x 2, fhr reactive, received order to discharge home.

## 2011-11-02 NOTE — MAU Note (Signed)
Pt presents to MaU with chief complaint of contractions. Pt says they are so strong she has been unable to sleep. G3P1

## 2011-11-02 NOTE — MAU Note (Signed)
Patient states she is having contractions every 4-5 minutes with bloody show. Reports fetal movement.

## 2011-11-02 NOTE — Progress Notes (Signed)
Kim booker cnm called as back up

## 2011-11-02 NOTE — H&P (Signed)
This is Dr. Francoise Ceo dictating the history and physical on  Angela Herring she's a 26 year old gravida 3 para 2 all 1 to at 40 weeks and 6 days EDC 1012 negative GBS she was admitted in labor 5 cm bit rapid progress had a normal vaginal delivery by the midwife past medical history negative Past surgical history negative Social history negative System review noncontributory Physical exam well-developed female postpartum HEENT negative Breasts negative Heart regular rhythm no murmurs no gallops Lungs clear to P&A Abdomen 20 week size uterus postpartum Pelvic deferred Extremities negative and

## 2011-11-03 LAB — CBC
HCT: 28 % — ABNORMAL LOW (ref 36.0–46.0)
Hemoglobin: 9.1 g/dL — ABNORMAL LOW (ref 12.0–15.0)
MCH: 26.4 pg (ref 26.0–34.0)
MCHC: 32.5 g/dL (ref 30.0–36.0)
MCV: 81.2 fL (ref 78.0–100.0)
RBC: 3.45 MIL/uL — ABNORMAL LOW (ref 3.87–5.11)

## 2011-11-03 MED ORDER — INFLUENZA VIRUS VACC SPLIT PF IM SUSP
0.5000 mL | INTRAMUSCULAR | Status: AC
  Administered 2011-11-04: 0.5 mL via INTRAMUSCULAR
  Filled 2011-11-03: qty 0.5

## 2011-11-03 NOTE — Progress Notes (Signed)
Patient ID: Angela Herring, female   DOB: 01-Aug-1985, 26 y.o.   MRN: [id number] Postpartum day one Vital signs normal Fundus firm Lochia moderate No complaints

## 2011-11-04 MED ORDER — OXYCODONE-ACETAMINOPHEN 5-325 MG PO TABS: 1.0000 | ORAL_TABLET | ORAL | Status: DC | PRN

## 2011-11-04 MED ORDER — FUSION PLUS PO CAPS: 1.0000 | ORAL_CAPSULE | Freq: Every day | ORAL | Status: DC

## 2011-11-04 MED ORDER — IBUPROFEN 600 MG PO TABS: 600.0000 mg | ORAL_TABLET | Freq: Four times a day (QID) | ORAL | Status: DC | PRN

## 2011-11-04 NOTE — Progress Notes (Signed)
Ur chart review completed.  

## 2011-11-04 NOTE — Discharge Summary (Signed)
Obstetric Discharge Summary Reason for Admission: onset of labor Prenatal Procedures: ultrasound Intrapartum Procedures: spontaneous vaginal delivery Postpartum Procedures: none Complications-Operative and Postpartum: none Hemoglobin  Date Value Range Status  11/03/2011 9.1* 12.0 - 15.0 g/dL Final     HCT  Date Value Range Status  11/03/2011 28.0* 36.0 - 46.0 % Final    Physical Exam:  General: alert and no distress Lochia: appropriate Uterine Fundus: firm Incision: none DVT Evaluation: No evidence of DVT seen on physical exam.  Discharge Diagnoses: Term Pregnancy-delivered  Discharge Information: Date: 11/04/2011 Activity: pelvic rest Diet: routine Medications: PNV, Ibuprofen, Colace, Iron and Percocet Condition: stable Instructions: refer to practice specific booklet Discharge to: home Follow-up Information    Follow up with HARPER,CHARLES A, MD. Schedule an appointment as soon as possible for a visit in 6 weeks.   Contact information:   [address] [place] [address] [phone]          Newborn Data: Live born female  Birth Weight: 8 lb 0.9 oz (3654 g) APGAR: 9, 9  Home with mother.  HARPER,CHARLES A 11/04/2011, 5:53 AM

## 2011-11-04 NOTE — Progress Notes (Signed)
Post Partum Day 2 Subjective: no complaints  Objective: Blood pressure 102/68, pulse 86, temperature 98.4 F (36.9 C), temperature source Oral, resp. rate 18, SpO2 99.00%, unknown if currently breastfeeding.  Physical Exam:  General: alert and no distress Lochia: appropriate Uterine Fundus: firm Incision: none DVT Evaluation: No evidence of DVT seen on physical exam.   Basename 11/03/11 0600 11/02/11 1220  HGB 9.1* 10.1*  HCT 28.0* 31.7*    Assessment/Plan: Discharge home   LOS: 2 days   HARPER,CHARLES A 11/04/2011, 5:52 AM

## 2012-03-20 IMAGING — US US FETAL BPP W/O NONSTRESS
1 series · 10 of 10 positions shown · non-contrast
Comparison: none

[Series 1: us fetal bpp w/o nonstress · non-contrast · 10 acquisitions, 10 frames shown]
[im 1/10]
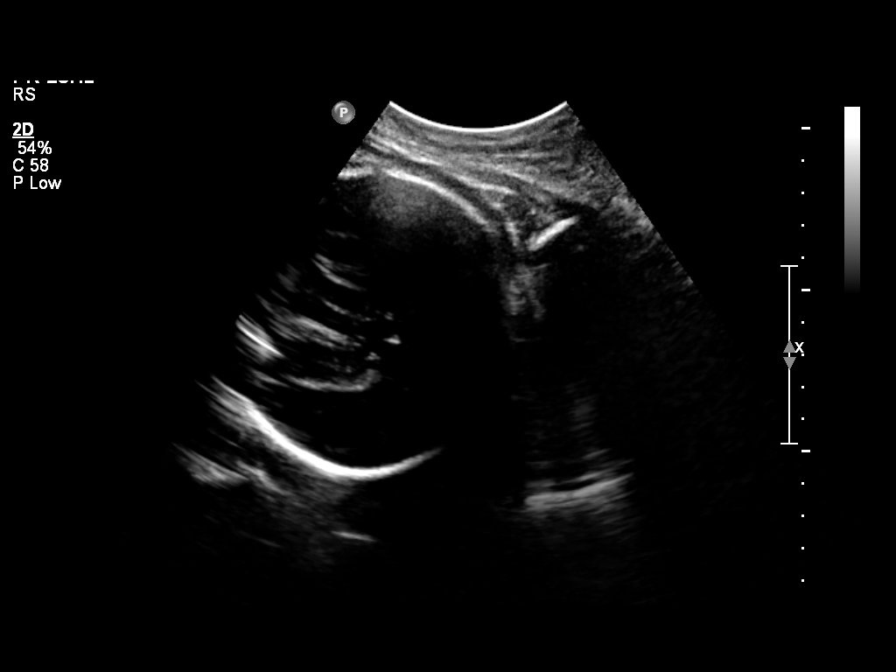
[im 2/10]
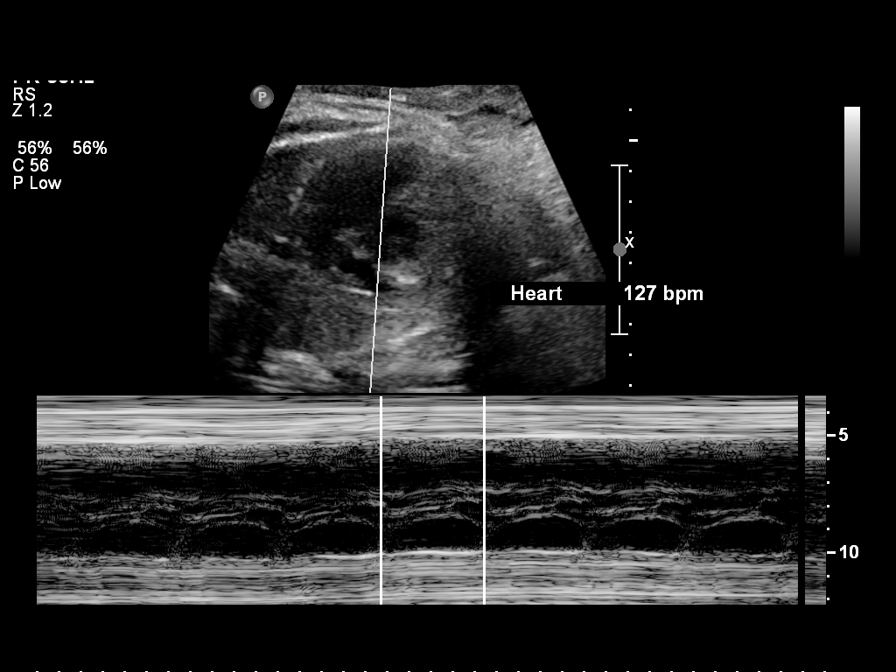
[im 3/10]
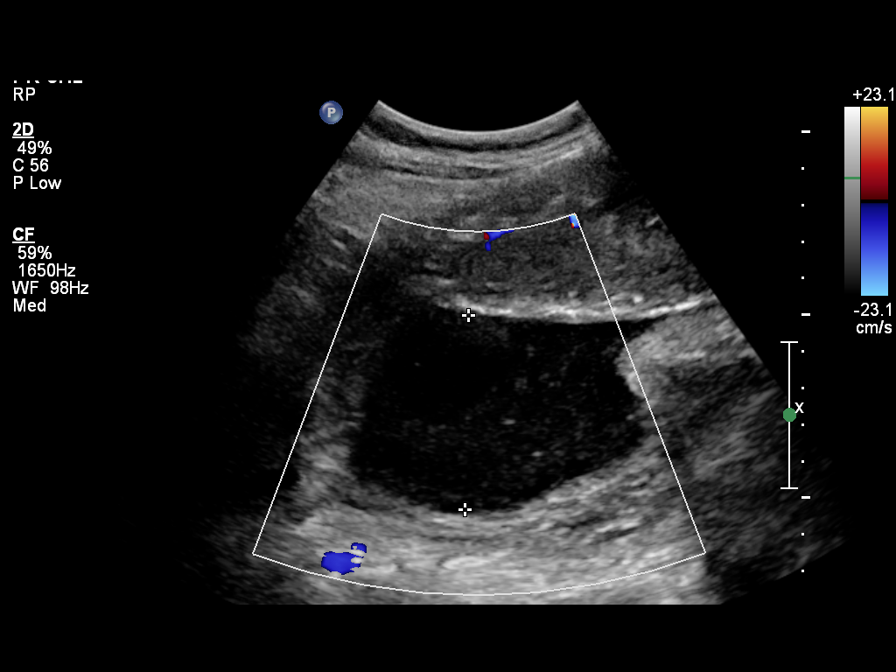
[im 4/10]
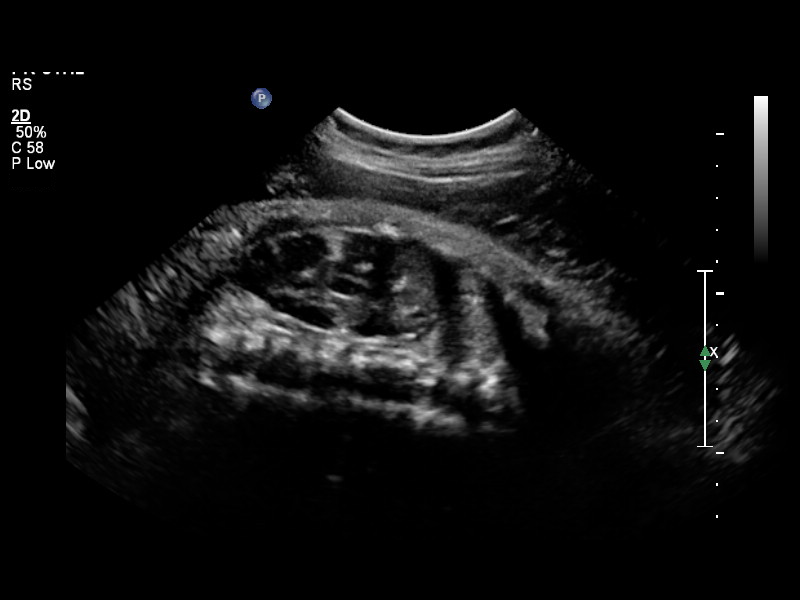
[im 5/10]
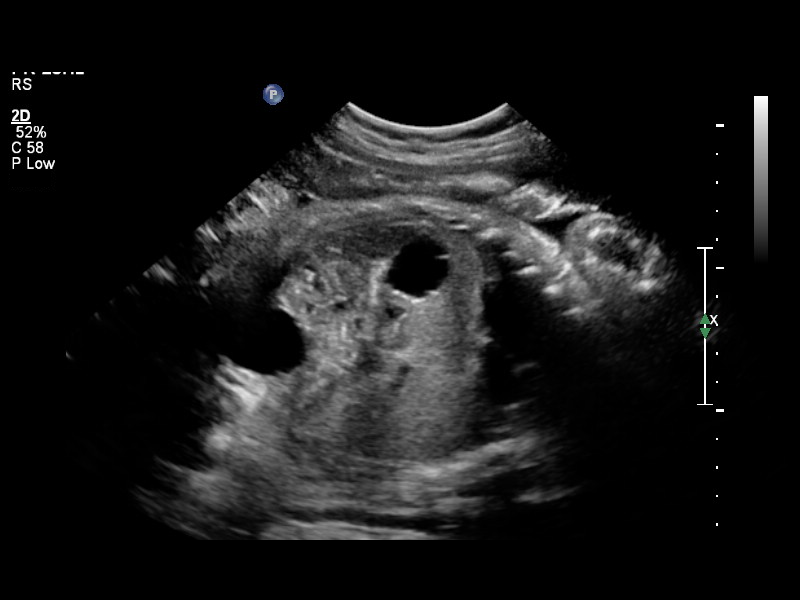
[im 6/10]
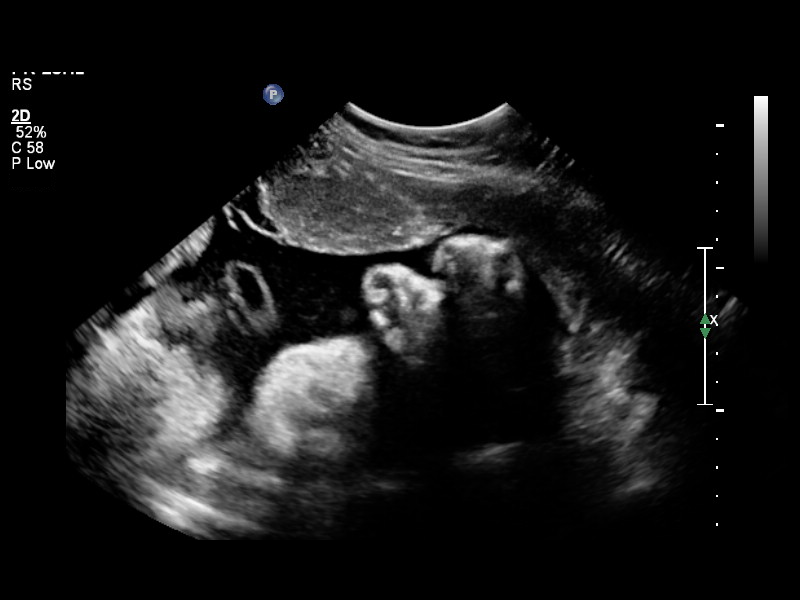
[im 7/10]
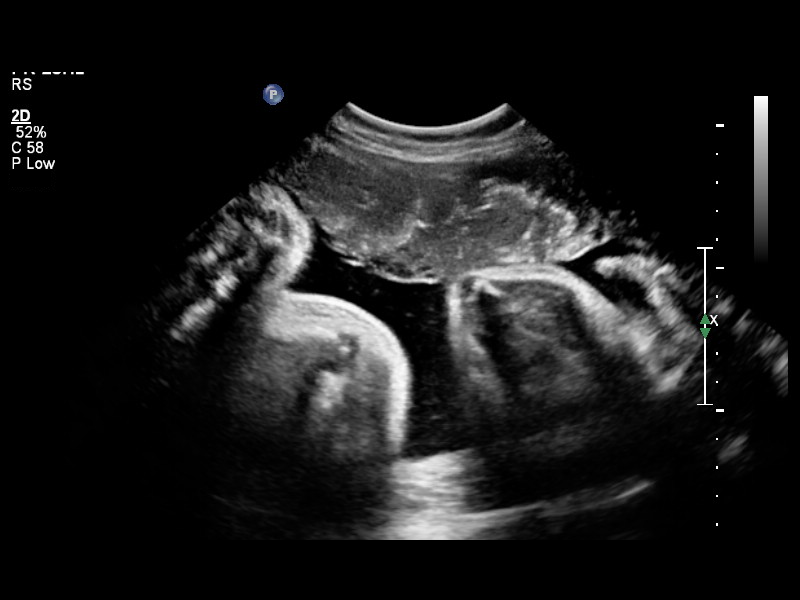
[im 8/10]
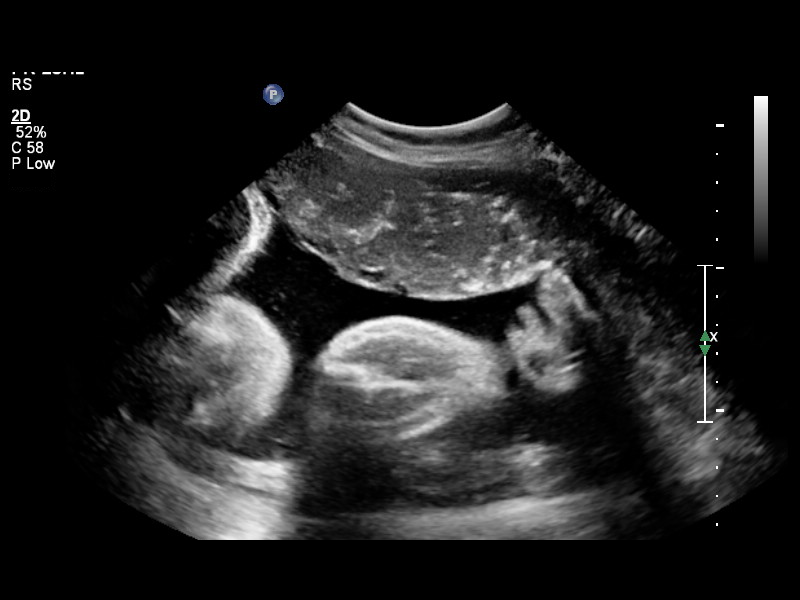
[im 9/10]
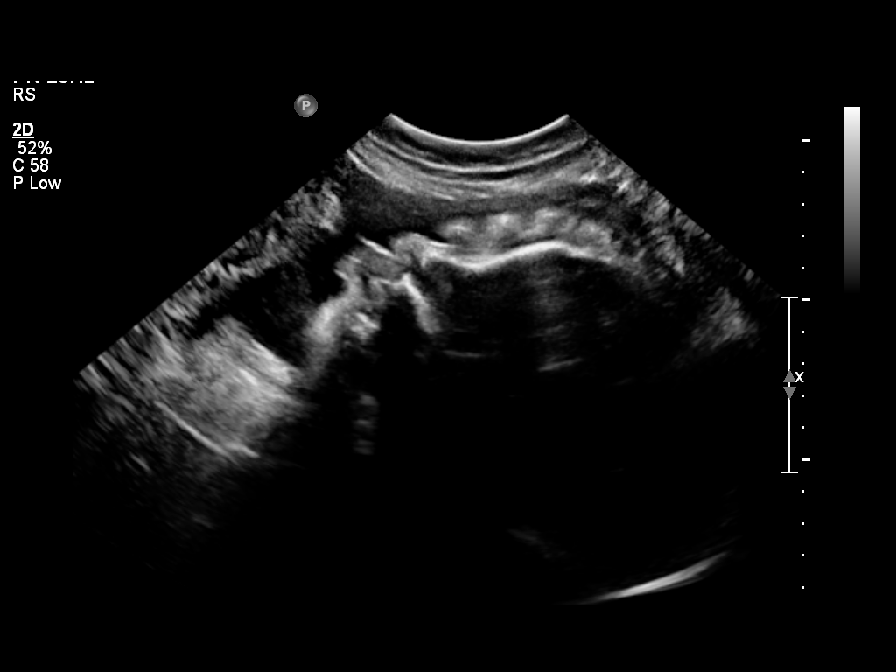
[im 10/10]
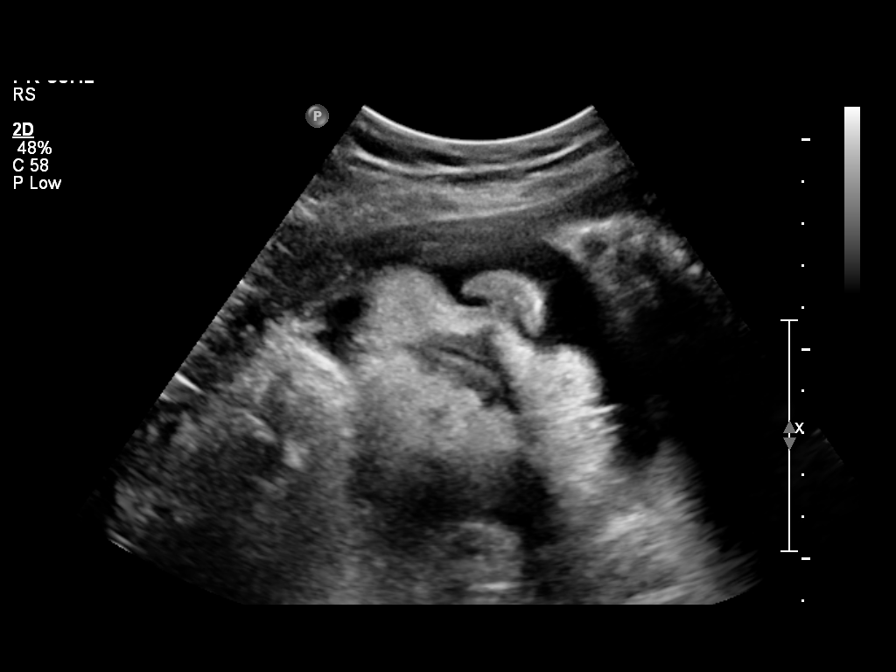

[10 of 10 positions shown; findings below may reference images not displayed]

OBSTETRICS REPORT
                      (Signed Final 08/20/2010 [DATE])

Procedures

Indications

 Pain - Abdominal/Pelvic
 Non-reactive NST
 Assess fetal well being
Fetal Evaluation

 Fetal Heart Rate:  127                          bpm
 Cardiac Activity:  Observed
 Presentation:      Cephalic

 Amniotic Fluid
 AFI FV:      Subjectively within normal limits
                                             Larg Pckt:     5.3  cm
Biophysical Evaluation

 Amniotic F.V:   Pocket => 2 cm two         F. Tone:        Observed
                 planes
 F. Movement:    Observed                   Score:          [DATE]
 F. Breathing:   Observed
Gestational Age

 Clinical EDD:  39w 2d                                        EDD:   08/25/10
 Best:          39w 2d     Det. By:  Clinical EDD             EDD:   08/25/10
Impression

 Single living intrauterine pregnancy in cephalic presentation.
 The estimated gestational age is 39w 2d based on Clinical
 EDD. The biophysical profile is [DATE]. Amniotic fluid volume is
 subjectively normal.

 questions or concerns.

## 2013-04-23 ENCOUNTER — Ambulatory Visit
Admission: RE | Admit: 2013-04-23 | Discharge: 2013-04-23 | Disposition: A | Discharge status: Home / Self Care | Payer: No Typology Code available for payment source | Source: Ambulatory Visit | Attending: Family Medicine | Admitting: Family Medicine

## 2013-04-23 ENCOUNTER — Other Ambulatory Visit: Payer: Self-pay | Admitting: Family Medicine

## 2013-04-23 ENCOUNTER — Ambulatory Visit (INDEPENDENT_AMBULATORY_CARE_PROVIDER_SITE_OTHER): Payer: No Typology Code available for payment source | Admitting: Family Medicine

## 2013-04-23 VITALS — BP 110/64 | HR 83 | Temp 97.9°F | Resp 18 | Ht 62.5 in | Wt 139.0 lb

## 2013-04-23 DIAGNOSIS — R928 Other abnormal and inconclusive findings on diagnostic imaging of breast: Secondary | ICD-10-CM

## 2013-04-23 DIAGNOSIS — N63 Unspecified lump in unspecified breast: Secondary | ICD-10-CM

## 2013-04-23 NOTE — Patient Instructions (Addendum)
Ibuprofen si necesario por dolor. voy obtenecer sonografia hoy.    [address], [place], [place] [address]?    Show: Text only  [employer]  [address]   1. Head west on [address]  go 0.2 mi  total 0.2 mi  Show: Text only  [employer]  [address]   2. Turn right onto [address]  About 3 mins  go 1.6 mi  total 1.7 mi  Show: Text only  [employer]  [address] [place]   3. Turn left onto [address]  go 0.2 mi  total 1.9 mi  Show: Text only  [employer]  [address] [place]   4. Turn right to merge onto [address]  About 7 mins  go 5.1 mi  total 7.0 mi  Show: Text only  [employer]  [address] [place]   5. Turn left onto [address]  Destination will be on the right  go 463 ft  total 7.1 mi  Show: Text only  [address]   [address], [place], [place] [address]?

## 2013-04-23 NOTE — Progress Notes (Addendum)
Subjective:  This chart was scribed for Angela FloodJeffrey R Cathren Sween, MD by Angela Herring, ED Scribe. This patient was seen in room 8 and the patient's care was started 10:30 AM.    Patient ID: Angela Herring, female    DOB: April 28, 1985, 28 y.o.   MRN: [id number]  HPI HPI Comments: Angela Herring is a 28 y.o. female who [facility] to [facility] complaining of about 3 months of gradual onset, gradually worsening, constant nodule to the upper right breast. She reports associated soreness for the past 2 days. She had similar symptoms when her daughter was first born 1 year and 3 months ago. It resolved with warm compresses at that time without taking antibiotics. She is currently breast feeding. She denies any associated pain, discharge, fever. She denies family history of breast cancer.    Patient Active Problem List   Diagnosis Date Noted  . Normal pregnancy 08/31/2010  . Post-term pregnancy, 40-42 weeks of gestation 08/31/2010   Past Medical History  Diagnosis Date  . Miscarriage   . No pertinent past medical history    Past Surgical History  Procedure Laterality Date  . Dilation and curettage of uterus    . Tumor removal     No Known Allergies Prior to Admission medications   Medication Sig Start Date End Date Taking? Authorizing Provider  ibuprofen (ADVIL,MOTRIN) 600 MG tablet Take 1 tablet (600 mg total) by mouth every 6 (six) hours as needed for pain (pain scale < 4). 11/04/11  Yes Brock Badharles A Harper, MD  Iron-FA-B Cmp-C-Biot-Probiotic (FUSION PLUS) CAPS Take 1 capsule by mouth daily before breakfast. 11/04/11   Brock Badharles A Harper, MD  oxyCODONE-acetaminophen (PERCOCET/ROXICET) 5-325 MG per tablet Take 1-2 tablets by mouth every 3 (three) hours as needed for pain (for pain scale > 4). 11/04/11   Brock Badharles A Harper, MD   History   Social History  . Marital Status: Married    Spouse Name: N/A    Number of Children: N/A  . Years of Education: N/A   Occupational History  . Not on file.   Social History  Main Topics  . Smoking status: Never Smoker   . Smokeless tobacco: Never Used  . Alcohol Use: No  . Drug Use: No  . Sexual Activity: Yes    Birth Control/ Protection: Coitus interruptus   Other Topics Concern  . Not on file   Social History Narrative  . No narrative on file      Review of Systems  Constitutional: Negative for fever.  Cardiovascular: Negative for chest pain.  Skin:       Nodule to the right upper breast.  Negative pain or discharge.        Objective:   Physical Exam  Nursing note and vitals reviewed. Constitutional: She is oriented to person, place, and time. She appears well-developed and well-nourished.  HENT:  Head: Normocephalic and atraumatic.  Cardiovascular: Normal rate.   Pulmonary/Chest: Effort normal. Right breast exhibits no skin change and no tenderness. Left breast exhibits no skin change and no tenderness.  Approximately 2.5 cm in diameter, firm, nodular area in the upper mid aspect of the right breast. Non tender, mobile. No skin changes. No nipple retraction, no peau d'orange changes. No erythema.    Abdominal: She exhibits no distension.  Lymphadenopathy:    She has no axillary adenopathy.       Right: No supraclavicular adenopathy present.       Left: No supraclavicular adenopathy present.  No supraclavicular or axillary  lymphadenopathy.   Neurological: She is alert and oriented to person, place, and time.  Skin: Skin is warm and dry.  Psychiatric: She has a normal mood and affect.   Chaperoned breast exam performed with female student and scribe present.    Filed Vitals:   04/23/13 0955  BP: 110/64  Pulse: 83  Temp: 97.9 F (36.6 C)  TempSrc: Oral  Resp: 18  Height: 5' 2.5" (1.588 m)  Weight: 139 lb (63.05 kg)  SpO2: 99%       Assessment & Plan:  Angela Herring is a 28 y.o. female Breast nodule - Plan: US BREAST COMPLETE UNI RIGHT INC AXILLA  Lactating mother - Plan: US BREAST COMPLETE UNI RIGHT INC AXILLA  R upper  breast nodular/cystic area. Breastfeeding.  DDX of early/mild lactational mastitis, but nontender and no f/c or systemic symptoms. Will schedule  U/S to further determine cause today, antiinflammatories only for now,  Continue to breastfeed.    No orders of the defined types were placed in this encounter.   Patient Instructions  Ibuprofen si necesario por dolor. voy obtenecer sonografia hoy.   I personally performed the services described in this documentation, which was scribed in my presence. The recorded information has been reviewed and considered, and addended by me as needed.

## 2016-01-22 NOTE — L&D Delivery Note (Signed)
Patient is 31 y.o. [id number] [redacted]w[redacted]d admitted for IOL for PD. S/p IOL with foley bulb, cytotec, followed by Pitocin. AROM at 0325.  Prenatal course also complicated by anemia.  Delivery Note At 6:04 AM a viable female was delivered via Vaginal, Spontaneous Delivery (Presentation: ROA).  Baby delivered with ease and without complication. APGAR: 9, 9; weight pending.   Placenta status: Intact.  Cord: 3V  with the following complications: None.  Cord pH: N/A  Anesthesia:  Epidural Episiotomy:  None Lacerations:  None Est. Blood Loss (mL): 50  Fundus firm with massage and Pitocin. Perineum inspected and found to have  good hemostasis achieved. Counts of sharps, instruments, and lap pads were all correct.  Mom to postpartum.  Baby to Couplet care / Skin to Skin.  Caryl Ada, DO OB Fellow

## 2016-04-08 ENCOUNTER — Other Ambulatory Visit (HOSPITAL_COMMUNITY)
Admission: RE | Admit: 2016-04-08 | Discharge: 2016-04-08 | Disposition: A | Discharge status: Home / Self Care | Payer: Medicaid Other | Source: Ambulatory Visit | Attending: Certified Nurse Midwife | Admitting: Certified Nurse Midwife

## 2016-04-08 ENCOUNTER — Ambulatory Visit (INDEPENDENT_AMBULATORY_CARE_PROVIDER_SITE_OTHER): Payer: Medicaid Other | Admitting: Certified Nurse Midwife

## 2016-04-08 ENCOUNTER — Encounter: Payer: Self-pay | Admitting: Certified Nurse Midwife

## 2016-04-08 VITALS — BP 107/66 | HR 71 | Temp 98.3°F | Wt 139.4 lb

## 2016-04-08 DIAGNOSIS — Z3490 Encounter for supervision of normal pregnancy, unspecified, unspecified trimester: Secondary | ICD-10-CM | POA: Insufficient documentation

## 2016-04-08 DIAGNOSIS — Z3481 Encounter for supervision of other normal pregnancy, first trimester: Secondary | ICD-10-CM

## 2016-04-08 DIAGNOSIS — Z3A12 12 weeks gestation of pregnancy: Secondary | ICD-10-CM | POA: Insufficient documentation

## 2016-04-08 DIAGNOSIS — Z3480 Encounter for supervision of other normal pregnancy, unspecified trimester: Secondary | ICD-10-CM

## 2016-04-08 DIAGNOSIS — Z789 Other specified health status: Secondary | ICD-10-CM

## 2016-04-08 DIAGNOSIS — O219 Vomiting of pregnancy, unspecified: Secondary | ICD-10-CM

## 2016-04-08 MED ORDER — DOXYLAMINE-PYRIDOXINE 10-10 MG PO TBEC: DELAYED_RELEASE_TABLET | ORAL | 4 refills | Status: DC

## 2016-04-08 MED ORDER — PRENATE PIXIE 10-0.6-0.4-200 MG PO CAPS: 1.0000 | ORAL_CAPSULE | Freq: Every day | ORAL | 12 refills | Status: DC

## 2016-04-08 NOTE — Progress Notes (Signed)
Patient is in the office for initial ob visit, states no complaints.

## 2016-04-08 NOTE — Progress Notes (Signed)
Subjective:    Angela Herring is being seen today for her first obstetrical visit.  This is a planned pregnancy. She is at [redacted]w[redacted]d gestation. Her obstetrical history is significant for none. Relationship with FOB: spouse, living together. Patient does intend to breast feed. Pregnancy history fully reviewed.  The information documented in the HPI was reviewed and verified.  Menstrual History: OB History    Gravida Para Term Preterm AB Living   4 2 2   1 2    SAB TAB Ectopic Multiple Live Births   1       2       Patient's last menstrual period was 01/11/2016.    Past Medical History:  Diagnosis Date  . Miscarriage   . No pertinent past medical history     Past Surgical History:  Procedure Laterality Date  . DILATION AND CURETTAGE OF UTERUS    . TUMOR REMOVAL Left    Left oophrectomy for benign tumor     (Not in a hospital admission) No Known Allergies  Social History  Substance Use Topics  . Smoking status: Never Smoker  . Smokeless tobacco: Never Used  . Alcohol use No    History reviewed. No pertinent family history.   Review of Systems Constitutional: negative for weight loss Gastrointestinal: negative for vomiting, +nausea Genitourinary:negative for genital lesions and vaginal discharge and dysuria Musculoskeletal:negative for back pain Behavioral/Psych: negative for abusive relationship, depression, illegal drug usage and tobacco use    Objective:    BP 107/66   Pulse 71   Temp 98.3 F (36.8 C)   Wt 139 lb 6.4 oz (63.2 kg)   LMP 01/11/2016   BMI 25.09 kg/m  General Appearance:    Alert, cooperative, no distress, appears stated age  Head:    Normocephalic, without obvious abnormality, atraumatic  Eyes:    PERRL, conjunctiva/corneas clear, EOM's intact, fundi    benign, both eyes  Ears:    Normal TM's and external ear canals, both ears  Nose:   Nares normal, septum midline, mucosa normal, no drainage    or sinus tenderness  Throat:   Lips, mucosa, and  tongue normal; teeth and gums normal  Neck:   Supple, symmetrical, trachea midline, no adenopathy;    thyroid:  no enlargement/tenderness/nodules; no carotid   bruit or JVD  Back:     Symmetric, no curvature, ROM normal, no CVA tenderness  Lungs:     Clear to auscultation bilaterally, respirations unlabored  Chest Wall:    No tenderness or deformity   Heart:    Regular rate and rhythm, S1 and S2 normal, no murmur, rub   or gallop  Breast Exam:    No tenderness, masses, or nipple abnormality  Abdomen:     Soft, non-tender, bowel sounds active all four quadrants,    no masses, no organomegaly  Genitalia:    Normal female without lesion, discharge or tenderness  Extremities:   Extremities normal, atraumatic, no cyanosis or edema  Pulses:   2+ and symmetric all extremities  Skin:   Skin color, texture, turgor normal, no rashes or lesions  Lymph nodes:   Cervical, supraclavicular, and axillary nodes normal  Neurologic:   CNII-XII intact, normal strength, sensation and reflexes    throughout                        Cervix: long, thick, closed and posterior.  FH: less than U. FHR: 166 by doppler.   Lab  Review Urine pregnancy test Labs reviewed yes Radiologic studies reviewed no Assessment:    Pregnancy at 4019w4d weeks   Supervision of other normal pregnancy, antepartum - Plan: Cytology - PAP, Varicella zoster antibody, IgG, Hemoglobinopathy evaluation, Vitamin D (25 hydroxy), Culture, OB Urine, Obstetric Panel, Including HIV, Cystic Fibrosis Mutation 97, MaterniT21 PLUS Core, Cervicovaginal ancillary only, Hemoglobin A1c, Prenat-FeAsp-Meth-FA-DHA w/o A (PRENATE PIXIE) 10-0.6-0.4-200 MG CAPS  Nausea and vomiting during pregnancy prior to [redacted] weeks gestation - Plan: Doxylamine-Pyridoxine (DICLEGIS) 10-10 MG TBEC  Non-English speaking patient   Plan:     Interpreter present for exam.  Prenatal vitamins.  Counseling provided regarding continued use of seat belts, cessation of alcohol  consumption, smoking or use of illicit drugs; infection precautions i.e., influenza/TDAP immunizations, toxoplasmosis,CMV, parvovirus, listeria and varicella; workplace safety, exercise during pregnancy; routine dental care, safe medications, sexual activity, hot tubs, saunas, pools, travel, caffeine use, fish and methlymercury, potential toxins, hair treatments, varicose veins Weight gain recommendations per IOM guidelines reviewed: underweight/BMI< 18.5--> gain 28 - 40 lbs; normal weight/BMI 18.5 - 24.9--> gain 25 - 35 lbs; overweight/BMI 25 - 29.9--> gain 15 - 25 lbs; obese/BMI >30->gain  11 - 20 lbs Problem list reviewed and updated. FIRST/CF mutation testing/NIPT/QUAD SCREEN/fragile X/Ashkenazi Jewish population testing/Spinal muscular atrophy discussed: ordered. Role of ultrasound in pregnancy discussed; fetal survey: requested. Amniocentesis discussed: not indicated.  Meds ordered this encounter  Medications  . Prenatal Vit-Fe Fumarate-FA (MULTIVITAMIN-PRENATAL) 27-0.8 MG TABS tablet    Sig: Take 1 tablet by mouth daily at 12 noon.   Orders Placed This Encounter  Procedures  . Culture, OB Urine  . Varicella zoster antibody, IgG  . Hemoglobinopathy evaluation  . Vitamin D (25 hydroxy)  . Obstetric Panel, Including HIV  . Cystic Fibrosis Mutation 97  . MaterniT21 PLUS Core    Order Specific Question:   Is the patient insulin dependent?    Answer:   No    Order Specific Question:   Please enter gestational age. This should be expressed as weeks AND days, i.e. 16w 6d. Enter weeks here. Enter days in next question.    Answer:   1112    Order Specific Question:   Please enter gestational age. This should be expressed as weeks AND days, i.e. 16w 6d. Enter days here. Enter weeks in previous question.    Answer:   4    Order Specific Question:   How was gestational age calculated?    Answer:   LMP    Order Specific Question:   Please give the date of LMP OR Ultrasound OR Estimated date of  delivery.    Answer:   10/17/2016    Order Specific Question:   Number of Fetuses (Type of Pregnancy):    Answer:   1    Order Specific Question:   Indications for performing the test? (please choose all that apply):    Answer:   Routine screening    Order Specific Question:   Other Indications? (Y=Yes, N=No)    Answer:   N    Order Specific Question:   If this is a repeat specimen, please indicate the reason:    Answer:   Not indicated    Order Specific Question:   Please specify the patient's race: (C=White/Caucasion, B=Black, I=Native American, A=Asian, H=Hispanic, O=Other, U=Unknown)    Answer:   H    Order Specific Question:   Donor Egg - indicate if the egg was obtained from in vitro fertilization.    Answer:  N    Order Specific Question:   Age of Egg Donor.    Answer:   50    Order Specific Question:   Prior Down Syndrome/ONTD screening during current pregnancy.    Answer:   N    Order Specific Question:   Prior First Trimester Testing    Answer:   N    Order Specific Question:   Prior Second Trimester Testing    Answer:   N    Order Specific Question:   Family History of Neural Tube Defects    Answer:   N    Order Specific Question:   Prior Pregnancy with Down Syndrome    Answer:   N    Order Specific Question:   Please give the patient's weight (in pounds)    Answer:   139  . Hemoglobin A1c    Follow up in 4 weeks. 50% of 30 min visit spent on counseling and coordination of care.

## 2016-04-09 LAB — CERVICOVAGINAL ANCILLARY ONLY
Bacterial vaginitis: NEGATIVE
CHLAMYDIA, DNA PROBE: NEGATIVE
Candida vaginitis: NEGATIVE
Neisseria Gonorrhea: NEGATIVE
Trichomonas: NEGATIVE

## 2016-04-10 LAB — CULTURE, OB URINE

## 2016-04-10 LAB — URINE CULTURE, OB REFLEX

## 2016-04-11 LAB — CYTOLOGY - PAP
DIAGNOSIS: NEGATIVE
HPV: NOT DETECTED

## 2016-04-15 ENCOUNTER — Other Ambulatory Visit: Payer: Self-pay | Admitting: Certified Nurse Midwife

## 2016-04-15 DIAGNOSIS — Z3480 Encounter for supervision of other normal pregnancy, unspecified trimester: Secondary | ICD-10-CM

## 2016-04-15 DIAGNOSIS — R7989 Other specified abnormal findings of blood chemistry: Secondary | ICD-10-CM | POA: Insufficient documentation

## 2016-04-15 LAB — MATERNIT 21 PLUS CORE, BLOOD
Chromosome 13: NEGATIVE
Chromosome 18: NEGATIVE
Chromosome 21: NEGATIVE
Y CHROMOSOME: DETECTED

## 2016-04-15 LAB — OBSTETRIC PANEL, INCLUDING HIV
Antibody Screen: NEGATIVE
BASOS: 0 %
Basophils Absolute: 0 10*3/uL (ref 0.0–0.2)
EOS (ABSOLUTE): 0.1 10*3/uL (ref 0.0–0.4)
EOS: 1 %
HEP B S AG: NEGATIVE
HIV Screen 4th Generation wRfx: NONREACTIVE
Hematocrit: 33.2 % — ABNORMAL LOW (ref 34.0–46.6)
Hemoglobin: 11.4 g/dL (ref 11.1–15.9)
IMMATURE GRANS (ABS): 0 10*3/uL (ref 0.0–0.1)
Immature Granulocytes: 0 %
LYMPHS: 21 %
Lymphocytes Absolute: 1.8 10*3/uL (ref 0.7–3.1)
MCH: 31 pg (ref 26.6–33.0)
MCHC: 34.3 g/dL (ref 31.5–35.7)
MCV: 90 fL (ref 79–97)
MONOCYTES: 6 %
MONOS ABS: 0.5 10*3/uL (ref 0.1–0.9)
Neutrophils Absolute: 5.9 10*3/uL (ref 1.4–7.0)
Neutrophils: 72 %
Platelets: 152 10*3/uL (ref 150–379)
RBC: 3.68 x10E6/uL — ABNORMAL LOW (ref 3.77–5.28)
RDW: 14.3 % (ref 12.3–15.4)
RH TYPE: POSITIVE
RPR Ser Ql: NONREACTIVE
RUBELLA: 4.54 {index} (ref >0.99)
WBC: 8.3 10*3/uL (ref 3.4–10.8)

## 2016-04-15 LAB — HEMOGLOBIN A1C
Est. average glucose Bld gHb Est-mCnc: 100 mg/dL
Hgb A1c MFr Bld: 5.1 % (ref 4.8–5.6)

## 2016-04-15 LAB — CYSTIC FIBROSIS MUTATION 97: Interpretation: NOT DETECTED

## 2016-04-15 LAB — HEMOGLOBINOPATHY EVALUATION
HGB C: 0 %
HGB S: 0 %
HGB VARIANT: 0 %
Hemoglobin A2 Quantitation: 2.6 % (ref 1.8–3.2)
Hemoglobin F Quantitation: 0 % (ref 0.0–2.0)
Hgb A: 97.4 % (ref 96.4–98.8)

## 2016-04-15 LAB — VARICELLA ZOSTER ANTIBODY, IGG: VARICELLA: 871 {index} (ref >165)

## 2016-04-15 LAB — VITAMIN D 25 HYDROXY (VIT D DEFICIENCY, FRACTURES): Vit D, 25-Hydroxy: 14.1 ng/mL — ABNORMAL LOW (ref 30.0–100.0)

## 2016-04-15 MED ORDER — VITAMIN D (ERGOCALCIFEROL) 1.25 MG (50000 UNIT) PO CAPS: 50000.0000 [IU] | ORAL_CAPSULE | ORAL | 2 refills | Status: DC

## 2016-05-06 ENCOUNTER — Encounter: Payer: Self-pay | Admitting: Certified Nurse Midwife

## 2016-05-06 ENCOUNTER — Ambulatory Visit (INDEPENDENT_AMBULATORY_CARE_PROVIDER_SITE_OTHER): Payer: Medicaid Other | Admitting: Certified Nurse Midwife

## 2016-05-06 VITALS — BP 94/51 | HR 77 | Wt 144.0 lb

## 2016-05-06 DIAGNOSIS — Z3480 Encounter for supervision of other normal pregnancy, unspecified trimester: Secondary | ICD-10-CM

## 2016-05-06 DIAGNOSIS — Z3482 Encounter for supervision of other normal pregnancy, second trimester: Secondary | ICD-10-CM

## 2016-05-06 NOTE — Progress Notes (Signed)
   PRENATAL VISIT NOTE  Subjective:  Angela Herring is a 31 y.o. [id number] at [redacted]w[redacted]d being seen today for ongoing prenatal care.  She is currently monitored for the following issues for this low-risk pregnancy and has Supervision of normal pregnancy, antepartum; Non-English speaking patient; and Low vitamin D level on her problem list.  Patient reports headache, no bleeding, no contractions, no cramping and no leaking.  Contractions: Not present. Vag. Bleeding: None.  Movement: Present. Denies leaking of fluid.   The following portions of the patient's history were reviewed and updated as appropriate: allergies, current medications, past family history, past medical history, past social history, past surgical history and problem list. Problem list updated.  Objective:   Vitals:   05/06/16 1614  BP: (!) 94/51  Pulse: 77  Weight: 144 lb (65.3 kg)    Fetal Status: Fetal Heart Rate (bpm): 145   Movement: Present     General:  Alert, oriented and cooperative. Patient is in no acute distress.  Skin: Skin is warm and dry. No rash noted.   Cardiovascular: Normal heart rate noted  Respiratory: Normal respiratory effort, no problems with respiration noted  Abdomen: Soft, gravid, appropriate for gestational age. Pain/Pressure: Absent     Pelvic:  Cervical exam deferred        Extremities: Normal range of motion.  Edema: None  Mental Status: Normal mood and affect. Normal behavior. Normal judgment and thought content.   Assessment and Plan:  Pregnancy: G2X5284 at [redacted]w[redacted]d  1. Supervision of other normal pregnancy, antepartum      Doing well - AFP, Serum, Open Spina Bifida - [place] MFM OB COMP + 14 WK; Future  Preterm labor symptoms and general obstetric precautions including but not limited to vaginal bleeding, contractions, leaking of fluid and fetal movement were reviewed in detail with the patient. Please refer to After Visit Summary for other counseling recommendations.  Return in about 4 weeks  (around 06/03/2016) for ROB.   Roe Coombs, CNM

## 2016-05-09 ENCOUNTER — Other Ambulatory Visit: Payer: Self-pay | Admitting: Certified Nurse Midwife

## 2016-05-09 DIAGNOSIS — Z3480 Encounter for supervision of other normal pregnancy, unspecified trimester: Secondary | ICD-10-CM

## 2016-05-09 LAB — AFP, SERUM, OPEN SPINA BIFIDA
AFP MoM: 1.23
AFP Value: 44.9 ng/mL
GEST. AGE ON COLLECTION DATE: 16.6 wk
Maternal Age At EDD: 33 yr
OSBR RISK 1 IN: 5926
Test Results:: NEGATIVE
Weight: 144 [lb_av]

## 2016-05-24 ENCOUNTER — Ambulatory Visit (HOSPITAL_COMMUNITY)
Admission: RE | Admit: 2016-05-24 | Discharge: 2016-05-24 | Disposition: A | Discharge status: Home / Self Care | Payer: Medicaid Other | Source: Ambulatory Visit | Attending: Certified Nurse Midwife | Admitting: Certified Nurse Midwife

## 2016-05-24 DIAGNOSIS — Z3480 Encounter for supervision of other normal pregnancy, unspecified trimester: Secondary | ICD-10-CM | POA: Diagnosis not present

## 2016-05-24 DIAGNOSIS — Z3A19 19 weeks gestation of pregnancy: Secondary | ICD-10-CM | POA: Insufficient documentation

## 2016-05-28 ENCOUNTER — Other Ambulatory Visit: Payer: Self-pay | Admitting: Certified Nurse Midwife

## 2016-05-28 DIAGNOSIS — Z3480 Encounter for supervision of other normal pregnancy, unspecified trimester: Secondary | ICD-10-CM

## 2016-06-03 ENCOUNTER — Ambulatory Visit (INDEPENDENT_AMBULATORY_CARE_PROVIDER_SITE_OTHER): Payer: Medicaid Other | Admitting: Certified Nurse Midwife

## 2016-06-03 ENCOUNTER — Encounter: Payer: Self-pay | Admitting: Certified Nurse Midwife

## 2016-06-03 VITALS — BP 98/64 | HR 89 | Wt 151.5 lb

## 2016-06-03 DIAGNOSIS — Z3482 Encounter for supervision of other normal pregnancy, second trimester: Secondary | ICD-10-CM

## 2016-06-03 DIAGNOSIS — Z789 Other specified health status: Secondary | ICD-10-CM

## 2016-06-03 DIAGNOSIS — R7989 Other specified abnormal findings of blood chemistry: Secondary | ICD-10-CM

## 2016-06-03 DIAGNOSIS — E559 Vitamin D deficiency, unspecified: Secondary | ICD-10-CM

## 2016-06-03 DIAGNOSIS — Z3480 Encounter for supervision of other normal pregnancy, unspecified trimester: Secondary | ICD-10-CM

## 2016-06-03 NOTE — Progress Notes (Signed)
   PRENATAL VISIT NOTE  Subjective:  Angela Herring is a 31 y.o. [id number] at 5357w4d being seen today for ongoing prenatal care.  She is currently monitored for the following issues for this low-risk pregnancy and has Supervision of normal pregnancy, antepartum; Non-English speaking patient; and Low vitamin D level on her problem list.  Patient reports backache, no bleeding, no contractions, no cramping and no leaking.  Contractions: Not present. Vag. Bleeding: None.  Movement: Present. Denies leaking of fluid.   The following portions of the patient's history were reviewed and updated as appropriate: allergies, current medications, past family history, past medical history, past social history, past surgical history and problem list. Problem list updated.  Objective:   Vitals:   06/03/16 1611 06/03/16 1614  BP: (!) 85/44 98/64  Pulse: 82 89  Weight: 151 lb 8 oz (68.7 kg)     Fetal Status: Fetal Heart Rate (bpm): 156 Fundal Height: 20 cm Movement: Present     General:  Alert, oriented and cooperative. Patient is in no acute distress.  Skin: Skin is warm and dry. No rash noted.   Cardiovascular: Normal heart rate noted  Respiratory: Normal respiratory effort, no problems with respiration noted  Abdomen: Soft, gravid, appropriate for gestational age. Pain/Pressure: Absent     Pelvic:  Cervical exam deferred        Extremities: Normal range of motion.  Edema: Trace  Mental Status: Normal mood and affect. Normal behavior. Normal judgment and thought content.   Assessment and Plan:  Pregnancy: G4W1027G4P2012 at 2857w4d  1. Supervision of other normal pregnancy, antepartum    Doing well.  Increased water intake encouraged.   2. Non-English speaking patient     Video interpreter used  3. Low vitamin D level     Taking weekly vitamin D.   Preterm labor symptoms and general obstetric precautions including but not limited to vaginal bleeding, contractions, leaking of fluid and fetal movement were  reviewed in detail with the patient. Please refer to After Visit Summary for other counseling recommendations.  Return in about 4 weeks (around 07/01/2016) for ROB.   Roe Coombsenney, Mujtaba Bollig A, CNM

## 2016-06-03 NOTE — Patient Instructions (Signed)
AREA PEDIATRIC/FAMILY PRACTICE PHYSICIANS  [facility] [address], [address] [place], [place]  [address] Phone - [phone]   Fax - [phone]  [facility] [address] [place], [place] [address] Phone - [phone]   Fax - [phone]  [facility] [address] [place], [place]  [address] Phone - [phone]   Fax - [phone]  [facility] [address] [place], [place]  [address] Phone - [phone]   Fax - [phone]  [facility] [address] [place], [place]  [address] Phone - [phone]   Fax - [phone]  [facility] [address] [place], [place]  [address] Phone - [phone]   Fax - [phone]  [facility] [address] [place], [place]  [address] Phone - [phone]   Fax - [phone]  [facility] [address], [address] [place], [place]  [address] Phone - [phone]   Fax - [phone]  [facility] [address] [place], [place]  [address] Phone - [phone]   Fax - [phone] [facility] [address] [place], [place]  [address] Phone - [phone]   Fax - [phone]  [facility] [address] [place], [place]  [address] Phone - [phone]   Fax - [phone]  [facility] [address] [place], [place]  [address] Phone - [phone]   Fax - [phone]  [facility] [address], [address] [place], [place]  [address] Phone - [phone]   Fax - [phone]  [facility] [address] [place], [place]  [address] Phone - [phone]  [facility] [address] [place], [place]  [address] Phone - [phone]   Fax - [phone]  [facility] [address] [place], [place]  [address] Phone - [phone]   Fax - [phone]  [facility] [address] [place], [place]  [address] Phone - [phone]   Fax - [phone]  [facility] [address] [place], [place]  [address] Phone - [phone]   Fax - [phone]   [facility] [address] [place], [place]  [address] Phone - [phone]   Fax - [phone]  [facility] [address] [place], [place]  [address] Phone - [phone]   Fax - [phone]  [facility] [address] , [place]  [address] Phone - [phone]   Fax - [phone]  [facility] [address] , [place]  [address] Phone - [phone]   Fax - [phone]  [facility] [address] , [place]  [address] Phone - [phone]   Fax - [phone] [facility] [address] [place], [place]  [address] Phone - [phone]   Fax - [phone]  [facility] [address] [place], [place]  [address] Phone - [phone]   Fax - [phone]   [facility] [address] [place], [place]  [address] Phone - [phone]   Fax - [phone]  [facility] Charlene Flemming MD [address] [place]  [address] Phone [phone]  Fax [phone]   

## 2016-06-03 NOTE — Progress Notes (Signed)
Patient reports good fetal movement and ocassional back pain, denies contractions.

## 2016-07-01 ENCOUNTER — Encounter: Payer: Medicaid Other | Admitting: Certified Nurse Midwife

## 2016-07-09 ENCOUNTER — Encounter: Payer: Self-pay | Admitting: Certified Nurse Midwife

## 2016-07-09 ENCOUNTER — Ambulatory Visit (INDEPENDENT_AMBULATORY_CARE_PROVIDER_SITE_OTHER): Payer: Medicaid Other | Admitting: Certified Nurse Midwife

## 2016-07-09 VITALS — BP 100/63 | HR 86 | Wt 156.6 lb

## 2016-07-09 DIAGNOSIS — Z789 Other specified health status: Secondary | ICD-10-CM

## 2016-07-09 DIAGNOSIS — R7989 Other specified abnormal findings of blood chemistry: Secondary | ICD-10-CM

## 2016-07-09 DIAGNOSIS — Z3482 Encounter for supervision of other normal pregnancy, second trimester: Secondary | ICD-10-CM

## 2016-07-09 DIAGNOSIS — Z3480 Encounter for supervision of other normal pregnancy, unspecified trimester: Secondary | ICD-10-CM

## 2016-07-09 NOTE — Progress Notes (Signed)
   PRENATAL VISIT NOTE  Subjective:  Angela Herring is a 31 y.o. [id number] at 591w5d being seen today for ongoing prenatal care.  She is currently monitored for the following issues for this low-risk pregnancy and has Supervision of normal pregnancy, antepartum; Non-English speaking patient; and Low vitamin D level on her problem list.  Patient reports no complaints.  Contractions: Not present. Vag. Bleeding: None.  Movement: Present. Denies leaking of fluid.   The following portions of the patient's history were reviewed and updated as appropriate: allergies, current medications, past family history, past medical history, past social history, past surgical history and problem list. Problem list updated.  Objective:   Vitals:   07/09/16 1611  BP: 100/63  Pulse: 86  Weight: 156 lb 9.6 oz (71 kg)    Fetal Status: Fetal Heart Rate (bpm): 142 Fundal Height: 25 cm Movement: Present     General:  Alert, oriented and cooperative. Patient is in no acute distress.  Skin: Skin is warm and dry. No rash noted.   Cardiovascular: Normal heart rate noted  Respiratory: Normal respiratory effort, no problems with respiration noted  Abdomen: Soft, gravid, appropriate for gestational age. Pain/Pressure: Absent     Pelvic:  Cervical exam deferred        Extremities: Normal range of motion.  Edema: Trace  Mental Status: Normal mood and affect. Normal behavior. Normal judgment and thought content.   Assessment and Plan:  Pregnancy: U1L2440G4P2012 at 421w5d  1. Supervision of other normal pregnancy, antepartum     Doing well.  Increased breaks at work encouraged, patient declined letter.    2. Non-English speaking patient     Video interpreter used.    3. Low vitamin D level     Taking weekly vitamin D.   Preterm labor symptoms and general obstetric precautions including but not limited to vaginal bleeding, contractions, leaking of fluid and fetal movement were reviewed in detail with the patient. Please refer  to After Visit Summary for other counseling recommendations.  Return in about 3 weeks (around 07/30/2016) for ROB, 2 hr OGTT.   Roe Coombsachelle A Kaisy Severino, CNM

## 2016-07-09 NOTE — Patient Instructions (Signed)
AREA PEDIATRIC/FAMILY PRACTICE PHYSICIANS  [facility] [address], [address] [place], [place]  [address] Phone - [phone]   Fax - [phone]  [facility] [address] [place], [place] [address] Phone - [phone]   Fax - [phone]  JACK AM[facility] [address] [place], [place]  [address] Phone - [phone]   Fax - [phone]  [facility] [address] [place], [place]  [address] Phone - [phone]   Fax - [phone]  [facility] [address] [place], [place]  [address] Phone - [phone]   Fax - [phone]  [facility] [address] [place], [place]  [address] Phone - [phone]   Fax - [phone]  [facility] [address] [place], [place]  [address] Phone - [phone]   Fax - [phone]  [facility] [address], [address] [place], [place]  [address] Phone - [phone]   Fax - [phone]  [facility] [address] [place], [place]  [address] Phone - [phone]   Fax - [phone] [facility] [address] [place], [place]  [address] Phone - [phone]   Fax - [phone]  [facility] [address] [place], [place]  [address] Phone - [phone]   Fax - [phone]  [facility] [address] [place], [place]  [address] Phone - [phone]   Fax - [phone]  [facility] [address], [address] [place], [place]  [address] Phone - [phone]   Fax - [phone]  SHILPA GOSRANI [address] [place], [place]  [address] Phone - [phone]  [facility] [address] [place], [place]  [address] Phone - [phone]   Fax - [phone]  [facility] [address] [place], [place]  [address] Phone - [phone]   Fax - [phone]  [facility] [address] [place], [place]  [address] Phone - [phone]   Fax - [phone]  [facility] [address] [place], [place]  [address] Phone - [phone]   Fax - [phone]   [facility] [address] [place], [place]  [address] Phone - [phone]   Fax - [phone]  [facility] [address] [place], [place]  [address] Phone - [phone]   Fax - [phone]  DAVID RUBIN [address] [place], [place]  [address] Phone - [phone]   Fax - [phone]  [facility] [address] [place], [place]  [address] Phone - [phone]   Fax - [phone]  [facility] [address] [place], [place]  [address] Phone - [phone]   Fax - [phone] [facility] [address] [place], [place]  [address] Phone - [phone]   Fax - [phone]  [facility] [address] [place], [place]  [address] Phone - [phone]   Fax - [phone]  [facility] [address] [place], [place]  [address] Phone - [phone]   Fax - [phone]  [facility] Charlene Flemming MD [address] [place]  [address] Phone [phone]  Fax [phone]   

## 2016-07-30 ENCOUNTER — Encounter: Payer: Medicaid Other | Admitting: Certified Nurse Midwife

## 2016-07-30 ENCOUNTER — Other Ambulatory Visit: Payer: Medicaid Other

## 2016-07-31 ENCOUNTER — Encounter: Payer: Medicaid Other | Admitting: Certified Nurse Midwife

## 2016-08-07 ENCOUNTER — Other Ambulatory Visit: Payer: Medicaid Other

## 2016-08-07 ENCOUNTER — Ambulatory Visit (INDEPENDENT_AMBULATORY_CARE_PROVIDER_SITE_OTHER): Payer: Medicaid Other | Admitting: Certified Nurse Midwife

## 2016-08-07 ENCOUNTER — Encounter: Payer: Self-pay | Admitting: Certified Nurse Midwife

## 2016-08-07 VITALS — BP 100/63 | HR 81 | Wt 161.4 lb

## 2016-08-07 DIAGNOSIS — Z3480 Encounter for supervision of other normal pregnancy, unspecified trimester: Secondary | ICD-10-CM

## 2016-08-07 DIAGNOSIS — R7989 Other specified abnormal findings of blood chemistry: Secondary | ICD-10-CM

## 2016-08-07 DIAGNOSIS — Z3483 Encounter for supervision of other normal pregnancy, third trimester: Secondary | ICD-10-CM

## 2016-08-07 DIAGNOSIS — E559 Vitamin D deficiency, unspecified: Secondary | ICD-10-CM

## 2016-08-07 DIAGNOSIS — Z789 Other specified health status: Secondary | ICD-10-CM

## 2016-08-07 NOTE — Progress Notes (Signed)
Patient reports good fetal movement, denies pain. 

## 2016-08-07 NOTE — Progress Notes (Signed)
   PRENATAL VISIT NOTE  Subjective:  Angela Herring is a 31 y.o. [id number] at 9026w6d being seen today for ongoing prenatal care.  She is currently monitored for the following issues for this low-risk pregnancy and has Supervision of normal pregnancy, antepartum; Non-English speaking patient; and Low vitamin D level on her problem list.  Patient reports no complaints.  Contractions: Not present. Vag. Bleeding: None.  Movement: Present. Denies leaking of fluid.   The following portions of the patient's history were reviewed and updated as appropriate: allergies, current medications, past family history, past medical history, past social history, past surgical history and problem list. Problem list updated.  Objective:   Vitals:   08/07/16 0811  BP: 100/63  Pulse: 81  Weight: 161 lb 6.4 oz (73.2 kg)    Fetal Status: Fetal Heart Rate (bpm): 130 Fundal Height: 29 cm Movement: Present     General:  Alert, oriented and cooperative. Patient is in no acute distress.  Skin: Skin is warm and dry. No rash noted.   Cardiovascular: Normal heart rate noted  Respiratory: Normal respiratory effort, no problems with respiration noted  Abdomen: Soft, gravid, appropriate for gestational age.  Pain/Pressure: Absent     Pelvic: Cervical exam deferred        Extremities: Normal range of motion.  Edema: Trace  Mental Status:  Normal mood and affect. Normal behavior. Normal judgment and thought content.   Assessment and Plan:  Pregnancy: [id number] at 7126w6d  1. Low vitamin D level     Taking weekly vitamin D.   2. Non-English speaking patient     Video interpreter used.  [facility] at [employer].   3. Supervision of other normal pregnancy, antepartum      Doing well. 2 hour OGTT today.   Preterm labor symptoms and general obstetric precautions including but not limited to vaginal bleeding, contractions, leaking of fluid and fetal movement were reviewed in detail with the patient. Please refer to After Visit Summary  for other counseling recommendations.  Return in about 2 weeks (around 08/21/2016) for ROB.   Roe Coombsachelle A Renetta Suman, CNM

## 2016-08-08 LAB — CBC
HEMATOCRIT: 30.9 % — AB (ref 34.0–46.6)
Hemoglobin: 10.1 g/dL — ABNORMAL LOW (ref 11.1–15.9)
MCH: 30.1 pg (ref 26.6–33.0)
MCHC: 32.7 g/dL (ref 31.5–35.7)
MCV: 92 fL (ref 79–97)
Platelets: 163 10*3/uL (ref 150–379)
RBC: 3.36 x10E6/uL — ABNORMAL LOW (ref 3.77–5.28)
RDW: 14 % (ref 12.3–15.4)
WBC: 7.1 10*3/uL (ref 3.4–10.8)

## 2016-08-08 LAB — GLUCOSE TOLERANCE, 2 HOURS W/ 1HR
GLUCOSE, FASTING: 76 mg/dL (ref 65–91)
Glucose, 1 hour: 93 mg/dL (ref 65–179)
Glucose, 2 hour: 96 mg/dL (ref 65–152)

## 2016-08-08 LAB — RPR: RPR Ser Ql: NONREACTIVE

## 2016-08-08 LAB — HIV ANTIBODY (ROUTINE TESTING W REFLEX): HIV SCREEN 4TH GENERATION: NONREACTIVE

## 2016-08-09 ENCOUNTER — Other Ambulatory Visit: Payer: Self-pay | Admitting: Certified Nurse Midwife

## 2016-08-09 DIAGNOSIS — O99013 Anemia complicating pregnancy, third trimester: Secondary | ICD-10-CM | POA: Insufficient documentation

## 2016-08-09 MED ORDER — CITRANATAL BLOOM 90-1 MG PO TABS
1.0000 | ORAL_TABLET | Freq: Every day | ORAL | 12 refills | Status: DC
Start: 2016-08-09 — End: 2016-10-10

## 2016-08-21 ENCOUNTER — Encounter: Payer: Medicaid Other | Admitting: Certified Nurse Midwife

## 2016-08-22 ENCOUNTER — Encounter: Payer: Medicaid Other | Admitting: Certified Nurse Midwife

## 2016-08-27 ENCOUNTER — Encounter: Payer: Self-pay | Admitting: Certified Nurse Midwife

## 2016-08-27 ENCOUNTER — Ambulatory Visit (INDEPENDENT_AMBULATORY_CARE_PROVIDER_SITE_OTHER): Payer: Medicaid Other | Admitting: Certified Nurse Midwife

## 2016-08-27 VITALS — BP 106/66 | HR 87 | Wt 163.2 lb

## 2016-08-27 DIAGNOSIS — R7989 Other specified abnormal findings of blood chemistry: Secondary | ICD-10-CM

## 2016-08-27 DIAGNOSIS — Z3480 Encounter for supervision of other normal pregnancy, unspecified trimester: Secondary | ICD-10-CM

## 2016-08-27 DIAGNOSIS — Z3483 Encounter for supervision of other normal pregnancy, third trimester: Secondary | ICD-10-CM

## 2016-08-27 DIAGNOSIS — D649 Anemia, unspecified: Secondary | ICD-10-CM

## 2016-08-27 DIAGNOSIS — O99013 Anemia complicating pregnancy, third trimester: Secondary | ICD-10-CM

## 2016-08-27 DIAGNOSIS — Z789 Other specified health status: Secondary | ICD-10-CM

## 2016-08-27 DIAGNOSIS — E559 Vitamin D deficiency, unspecified: Secondary | ICD-10-CM

## 2016-08-27 DIAGNOSIS — R399 Unspecified symptoms and signs involving the genitourinary system: Secondary | ICD-10-CM

## 2016-08-27 LAB — POCT URINALYSIS DIPSTICK
Bilirubin, UA: NEGATIVE
Blood, UA: NEGATIVE
Glucose, UA: NEGATIVE
Nitrite, UA: NEGATIVE
PH UA: 5 (ref 5.0–8.0)
SPEC GRAV UA: 1.01 (ref 1.010–1.025)
Urobilinogen, UA: NEGATIVE E.U./dL — AB

## 2016-08-27 NOTE — Progress Notes (Signed)
Patient thinks she has UTI.

## 2016-08-27 NOTE — Progress Notes (Signed)
   PRENATAL VISIT NOTE  Subjective:  Angela Herring is a 31 y.o. [id number] at 7267w5d being seen today for ongoing prenatal care.  She is currently monitored for the following issues for this low-risk pregnancy and has Supervision of normal pregnancy, antepartum; Non-English speaking patient; Low vitamin D level; and Anemia affecting pregnancy in third trimester on her problem list.  Patient reports no complaints.  Contractions: Not present. Vag. Bleeding: None.  Movement: Present. Denies leaking of fluid.   The following portions of the patient's history were reviewed and updated as appropriate: allergies, current medications, past family history, past medical history, past social history, past surgical history and problem list. Problem list updated.  Objective:   Vitals:   08/27/16 1612  BP: 106/66  Pulse: 87  Weight: 163 lb 3.2 oz (74 kg)    Fetal Status: Fetal Heart Rate (bpm): 139 Fundal Height: 32 cm Movement: Present     General:  Alert, oriented and cooperative. Patient is in no acute distress.  Skin: Skin is warm and dry. No rash noted.   Cardiovascular: Normal heart rate noted  Respiratory: Normal respiratory effort, no problems with respiration noted  Abdomen: Soft, gravid, appropriate for gestational age.  Pain/Pressure: Present     Pelvic: Cervical exam deferred        Extremities: Normal range of motion.  Edema: None  Mental Status:  Normal mood and affect. Normal behavior. Normal judgment and thought content.   Assessment and Plan:  Pregnancy: [id number] at 7767w5d  1. UTI symptoms      - POCT urinalysis dipstick  2. Supervision of other normal pregnancy, antepartum        3. Non-English speaking patient    Video inteterpreter used.   4. Low vitamin D level     Taking weekly vitamin D  5. Anemia affecting pregnancy in third trimester     Taking Bloom   Preterm labor symptoms and general obstetric precautions including but not limited to vaginal bleeding,  contractions, leaking of fluid and fetal movement were reviewed in detail with the patient. Please refer to After Visit Summary for other counseling recommendations.  Return in about 2 weeks (around 09/10/2016) for ROB.   Roe Coombsachelle A Tarell Schollmeyer, CNM

## 2016-08-27 NOTE — Patient Instructions (Signed)
AREA PEDIATRIC/FAMILY PRACTICE PHYSICIANS  [facility] [address] [place], [place]  [address] Phone - [phone]   Fax - [phone]  [facility] [address] [place], [place] [address] Phone - [phone]   Fax - [phone]  J[facility] AM[facility] [address] [place], [place]  [address] Phone - [phone]   Fax - [phone]  [facility] [address] [place], [place]  [address] Phone - [phone]   Fax - [phone]  [facility] [address] [place], [place]  [address] Phone - [phone]   Fax - [phone]  [facility] [address] [place], [place]  [address] Phone - [phone]   Fax - [phone]  [facility] [address] [place], [place]  [address] Phone - [phone]   Fax - [phone]  [facility] [address] [place], [place]  [address] Phone - [phone]   Fax - [phone]  [facility] [address] [place], [place]  [address] Phone - [phone]   Fax - [phone] [facility] [address] [place], [place]  [address] Phone - [phone]   Fax - [phone]  [facility] [address] [place], [place]  [address] Phone - [phone]   Fax - [phone]  [facility] [address] [place], [place]  [address] Phone - [phone]   Fax - [phone]  [facility] [address] [place], [place]  [address] Phone - [phone]   Fax - [phone]  SHILPA GOSRANI [address] [place], [place]  [address] Phone - [phone]  [facility] [address] [place], [place]  [address] Phone - [phone]   Fax - [phone]  [facility] [address] [place], [place]  [address] Phone - [phone]   Fax - [phone]  [facility] [address] [place], [place]  [address] Phone - [phone]   Fax - [phone]  [facility] [address] [place], [place]  [address] Phone - [phone]   Fax - [phone]   [facility] [address] [place], [place]  [address] Phone - [phone]   Fax - [phone]  [facility] [address] [place], [place]  [address] Phone - [phone]   Fax - [phone]  [facility] RUB[facility] [address] [place], [place]  [address] Phone - [phone]   Fax - [phone]  [facility] [address] , [place]  [address] Phone - [phone]   Fax - [phone]  [facility] [address] , [place]  [address] Phone - [phone]   Fax - [phone] [facility] [address] [place], [place]  [address] Phone - [phone]   Fax - [phone]  [facility] [address] [place], [place]  [address] Phone - [phone]   Fax - [phone]  [facility] [place] [facility] [place] [address] [place], [place]  [address] Phone - [phone]   Fax - [phone]  [facility] [place] Charlene Flemming MD [address]  [place] [address] Phone [phone]  Fax [phone]   

## 2016-09-10 ENCOUNTER — Encounter: Payer: Medicaid Other | Admitting: Certified Nurse Midwife

## 2016-09-16 ENCOUNTER — Other Ambulatory Visit (HOSPITAL_COMMUNITY)
Admission: RE | Admit: 2016-09-16 | Discharge: 2016-09-16 | Disposition: A | Discharge status: Home / Self Care | Payer: Medicaid Other | Source: Ambulatory Visit | Attending: Certified Nurse Midwife | Admitting: Certified Nurse Midwife

## 2016-09-16 ENCOUNTER — Ambulatory Visit (INDEPENDENT_AMBULATORY_CARE_PROVIDER_SITE_OTHER): Payer: Medicaid Other | Admitting: Advanced Practice Midwife

## 2016-09-16 ENCOUNTER — Encounter: Payer: Self-pay | Admitting: Advanced Practice Midwife

## 2016-09-16 VITALS — BP 102/64 | HR 85 | Wt 169.0 lb

## 2016-09-16 DIAGNOSIS — Z3480 Encounter for supervision of other normal pregnancy, unspecified trimester: Secondary | ICD-10-CM

## 2016-09-16 DIAGNOSIS — Z3483 Encounter for supervision of other normal pregnancy, third trimester: Secondary | ICD-10-CM | POA: Diagnosis not present

## 2016-09-16 DIAGNOSIS — O329XX0 Maternal care for malpresentation of fetus, unspecified, not applicable or unspecified: Secondary | ICD-10-CM

## 2016-09-16 LAB — OB RESULTS CONSOLE GC/CHLAMYDIA: GC PROBE AMP, GENITAL: NEGATIVE

## 2016-09-16 NOTE — Patient Instructions (Signed)
Tercer trimestre de embarazo (Third Trimester of Pregnancy) El tercer trimestre comprende desde la semana29 hasta la semana42, es decir, desde el mes7 hasta el mes9. El tercer trimestre es un perodo en el que el feto crece rpidamente. Hacia el final del noveno mes, el feto mide alrededor de 20pulgadas (45cm) de largo y pesa entre 6 y 10 libras (2,700 y 4,500kg). CAMBIOS EN EL ORGANISMO Su organismo atraviesa por muchos cambios durante el embarazo, y estos varan de una mujer a otra.  Seguir aumentando de peso. Es de esperar que aumente entre 25 y 35libras (11 y 16kg) hacia el final del embarazo.  Podrn aparecer las primeras estras en las caderas, el abdomen y las mamas.  Puede tener necesidad de orinar con ms frecuencia porque el feto baja hacia la pelvis y ejerce presin sobre la vejiga.  Debido al embarazo podr sentir acidez estomacal con frecuencia.  Puede estar estreida, ya que ciertas hormonas enlentecen los movimientos de los msculos que empujan los desechos a travs de los intestinos.  Pueden aparecer hemorroides o abultarse e hincharse las venas (venas varicosas).  Puede sentir dolor plvico debido al aumento de peso y a que las hormonas del embarazo relajan las articulaciones entre los huesos de la pelvis. El dolor de espalda puede ser consecuencia de la sobrecarga de los msculos que soportan la postura.  Tal vez haya cambios en el cabello que pueden incluir su engrosamiento, crecimiento rpido y cambios en la textura. Adems, a algunas mujeres se les cae el cabello durante o despus del embarazo, o tienen el cabello seco o fino. Lo ms probable es que el cabello se le normalice despus del nacimiento del beb.  Las mamas seguirn creciendo y le dolern. A veces, puede haber una secrecin amarilla de las mamas llamada calostro.  El ombligo puede salir hacia afuera.  Puede sentir que le falta el aire debido a que se expande el tero.  Puede notar que el feto  "baja" o lo siente ms bajo, en el abdomen.  Puede tener una prdida de secrecin mucosa con sangre. Esto suele ocurrir en el trmino de unos pocos das a una semana antes de que comience el trabajo de parto.  El cuello del tero se vuelve delgado y blando (se borra) cerca de la fecha de parto. QU DEBE ESPERAR EN LOS EXMENES PRENATALES Le harn exmenes prenatales cada 2semanas hasta la semana36. A partir de ese momento le harn exmenes semanales. Durante una visita prenatal de rutina:  La pesarn para asegurarse de que usted y el feto estn creciendo normalmente.  Le tomarn la presin arterial.  Le medirn el abdomen para controlar el desarrollo del beb.  Se escucharn los latidos cardacos fetales.  Se evaluarn los resultados de los estudios solicitados en visitas anteriores.  Le revisarn el cuello del tero cuando est prxima la fecha de parto para controlar si este se ha borrado. Alrededor de la semana36, el mdico le revisar el cuello del tero. Al mismo tiempo, realizar un anlisis de las secreciones del tejido vaginal. Este examen es para determinar si hay un tipo de bacteria, estreptococo Grupo B. El mdico le explicar esto con ms detalle. El mdico puede preguntarle lo siguiente:  Cmo le gustara que fuera el parto.  Cmo se siente.  Si siente los movimientos del beb.  Si ha tenido sntomas anormales, como prdida de lquido, sangrado, dolores de cabeza intensos o clicos abdominales.  Si est consumiendo algn producto que contenga tabaco, como cigarrillos, tabaco de mascar y   cigarrillos electrnicos.  Si tiene alguna pregunta. Otros exmenes o estudios de deteccin que pueden realizarse durante el tercer trimestre incluyen lo siguiente:  Anlisis de sangre para controlar los niveles de hierro (anemia).  Controles fetales para determinar su salud, nivel de actividad y crecimiento. Si tiene alguna enfermedad o hay problemas durante el embarazo, le harn  estudios.  Prueba del VIH (virus de inmunodeficiencia humana). Si corre un riesgo alto, pueden realizarle una prueba de deteccin del VIH durante el tercer trimestre del embarazo. FALSO TRABAJO DE PARTO Es posible que sienta contracciones leves e irregulares que finalmente desaparecen. Se llaman contracciones de Braxton Hicks o falso trabajo de parto. Las contracciones pueden durar horas, das o incluso semanas, antes de que el verdadero trabajo de parto se inicie. Si las contracciones ocurren a intervalos regulares, se intensifican o se hacen dolorosas, lo mejor es que la revise el mdico. SIGNOS DE TRABAJO DE PARTO  Clicos de tipo menstrual.  Contracciones cada 5minutos o menos.  Contracciones que comienzan en la parte superior del tero y se extienden hacia abajo, a la zona inferior del abdomen y la espalda.  Sensacin de mayor presin en la pelvis o dolor de espalda.  Una secrecin de mucosidad acuosa o con sangre que sale de la vagina. Si tiene alguno de estos signos antes de la semana37 del embarazo, llame a su mdico de inmediato. Debe concurrir al hospital para que la controlen inmediatamente. INSTRUCCIONES PARA EL CUIDADO EN EL HOGAR  Evite fumar, consumir hierbas, beber alcohol y tomar frmacos que no le hayan recetado. Estas sustancias qumicas afectan la formacin y el desarrollo del beb.  No consuma ningn producto que contenga tabaco, lo que incluye cigarrillos, tabaco de mascar y cigarrillos electrnicos. Si necesita ayuda para dejar de fumar, consulte al mdico. Puede recibir asesoramiento y otro tipo de recursos para dejar de fumar.  Siga las indicaciones del mdico en relacin con el uso de medicamentos. Durante el embarazo, hay medicamentos que son seguros de tomar y otros que no.  Haga ejercicio solamente como se lo haya indicado el mdico. Sentir clicos uterinos es un buen signo para detener la actividad fsica.  Contine comiendo alimentos sanos con  regularidad.  Use un sostn que le brinde buen soporte si le duelen las mamas.  No se d baos de inmersin en agua caliente, baos turcos ni saunas.  Use el cinturn de seguridad en todo momento mientras conduce.  No coma carne cruda ni queso sin cocinar; evite el contacto con las bandejas sanitarias de los gatos y la tierra que estos animales usan. Estos elementos contienen grmenes que pueden causar defectos congnitos en el beb.  Tome las vitaminas prenatales.  Tome entre 1500 y 2000mg de calcio diariamente comenzando en la semana20 del embarazo hasta el parto.  Si est estreida, pruebe un laxante suave (si el mdico lo autoriza). Consuma ms alimentos ricos en fibra, como vegetales y frutas frescos y cereales integrales. Beba gran cantidad de lquido para mantener la orina de tono claro o color amarillo plido.  Dese baos de asiento con agua tibia para aliviar el dolor o las molestias causadas por las hemorroides. Use una crema para las hemorroides si el mdico la autoriza.  Si tiene venas varicosas, use medias de descanso. Eleve los pies durante 15minutos, 3 o 4veces por da. Limite el consumo de sal en su dieta.  Evite levantar objetos pesados, use zapatos de tacones bajos y mantenga una buena postura.  Descanse con las piernas elevadas si tiene   calambres o dolor de cintura.  Visite a su dentista si no lo ha hecho durante el embarazo. Use un cepillo de dientes blando para higienizarse los dientes y psese el hilo dental con suavidad.  Puede seguir manteniendo relaciones sexuales, a menos que el mdico le indique lo contrario.  No haga viajes largos excepto que sea absolutamente necesario y solo con la autorizacin del mdico.  Tome clases prenatales para entender, practicar y hacer preguntas sobre el trabajo de parto y el parto.  Haga un ensayo de la partida al hospital.  Prepare el bolso que llevar al hospital.  Prepare la habitacin del beb.  Concurra a todas  las visitas prenatales segn las indicaciones de su mdico.  SOLICITE ATENCIN MDICA SI:  No est segura de que est en trabajo de parto o de que ha roto la bolsa de las aguas.  Tiene mareos.  Siente clicos leves, presin en la pelvis o dolor persistente en el abdomen.  Tiene nuseas, vmitos o diarrea persistentes.  Observa una secrecin vaginal con mal olor.  Siente dolor al orinar.  SOLICITE ATENCIN MDICA DE INMEDIATO SI:  Tiene fiebre.  Tiene una prdida de lquido por la vagina.  Tiene sangrado o pequeas prdidas vaginales.  Siente dolor intenso o clicos en el abdomen.  Sube o baja de peso rpidamente.  Tiene dificultad para respirar y siente dolor de pecho.  Sbitamente se le hinchan mucho el rostro, las manos, los tobillos, los pies o las piernas.  No ha sentido los movimientos del beb durante una hora.  Siente un dolor de cabeza intenso que no se alivia con medicamentos.  Su visin se modifica.  Esta informacin no tiene como fin reemplazar el consejo del mdico. Asegrese de hacerle al mdico cualquier pregunta que tenga. Document Released: 10/17/2004 Document Revised: 01/28/2014 Document Reviewed: 03/10/2012 Elsevier Interactive Patient Education  2017 Elsevier Inc.  

## 2016-09-16 NOTE — Progress Notes (Signed)
   PRENATAL VISIT NOTE  Subjective:  Angela Herring is a 31 y.o. [id number] at [redacted]w[redacted]d being seen today for ongoing prenatal care.  She is currently monitored for the following issues for this low-risk pregnancy and has Supervision of normal pregnancy, antepartum; Non-English speaking patient; Low vitamin D level; and Anemia affecting pregnancy in third trimester on her problem list.  Patient reports no complaints.  Contractions: Not present. Vag. Bleeding: None.  Movement: Present. Denies leaking of fluid.   The following portions of the patient's history were reviewed and updated as appropriate: allergies, current medications, past family history, past medical history, past social history, past surgical history and problem list. Problem list updated.  Objective:   Vitals:   09/16/16 1529  BP: 102/64  Pulse: 85  Weight: 169 lb (76.7 kg)    Fetal Status:     Movement: Present     General:  Alert, oriented and cooperative. Patient is in no acute distress.  Skin: Skin is warm and dry. No rash noted.   Cardiovascular: Normal heart rate noted  Respiratory: Normal respiratory effort, no problems with respiration noted  Abdomen: Soft, gravid, appropriate for gestational age.  Pain/Pressure: Present     Pelvic: Cervical exam performed        Extremities: Normal range of motion.  Edema: Trace  Mental Status:  Normal mood and affect. Normal behavior. Normal judgment and thought content.   Assessment and Plan:  Pregnancy: [id number] at [redacted]w[redacted]d  1. Malpresentation before onset of labor, single or unspecified fetus      Unable to palpate presenting part, will check [place] - [place] MFM OB LIMITED; Future  2. Supervision of other normal pregnancy, antepartum  - Strep Gp B NAA - Cervicovaginal ancillary only  Term labor symptoms and general obstetric precautions including but not limited to vaginal bleeding, contractions, leaking of fluid and fetal movement were reviewed in detail with the patient. Please  refer to After Visit Summary for other counseling recommendations.  RTO 1 week  Wynelle Bourgeois, CNM

## 2016-09-18 ENCOUNTER — Ambulatory Visit (HOSPITAL_COMMUNITY)
Admission: RE | Admit: 2016-09-18 | Discharge: 2016-09-18 | Disposition: A | Discharge status: Home / Self Care | Payer: Medicaid Other | Source: Ambulatory Visit | Attending: Advanced Practice Midwife | Admitting: Advanced Practice Midwife

## 2016-09-18 DIAGNOSIS — O329XX0 Maternal care for malpresentation of fetus, unspecified, not applicable or unspecified: Secondary | ICD-10-CM | POA: Diagnosis present

## 2016-09-18 DIAGNOSIS — Z3A35 35 weeks gestation of pregnancy: Secondary | ICD-10-CM | POA: Insufficient documentation

## 2016-09-18 LAB — CERVICOVAGINAL ANCILLARY ONLY
Chlamydia: NEGATIVE
Neisseria Gonorrhea: NEGATIVE

## 2016-09-18 LAB — STREP GP B NAA: STREP GROUP B AG: NEGATIVE

## 2016-09-24 ENCOUNTER — Encounter: Payer: Medicaid Other | Admitting: Certified Nurse Midwife

## 2016-09-30 ENCOUNTER — Encounter (HOSPITAL_COMMUNITY): Payer: Self-pay | Admitting: *Deleted

## 2016-09-30 ENCOUNTER — Inpatient Hospital Stay (HOSPITAL_COMMUNITY)
Admission: AD | Admit: 2016-09-30 | Discharge: 2016-10-01 | Disposition: A | Discharge status: Home / Self Care | Payer: Medicaid Other | Source: Ambulatory Visit | Attending: Obstetrics & Gynecology | Admitting: Obstetrics & Gynecology

## 2016-09-30 DIAGNOSIS — O9989 Other specified diseases and conditions complicating pregnancy, childbirth and the puerperium: Secondary | ICD-10-CM

## 2016-09-30 DIAGNOSIS — O2343 Unspecified infection of urinary tract in pregnancy, third trimester: Secondary | ICD-10-CM | POA: Diagnosis not present

## 2016-09-30 DIAGNOSIS — Z79899 Other long term (current) drug therapy: Secondary | ICD-10-CM | POA: Insufficient documentation

## 2016-09-30 DIAGNOSIS — Z3A37 37 weeks gestation of pregnancy: Secondary | ICD-10-CM | POA: Diagnosis not present

## 2016-09-30 DIAGNOSIS — O26893 Other specified pregnancy related conditions, third trimester: Secondary | ICD-10-CM | POA: Diagnosis present

## 2016-09-30 DIAGNOSIS — M545 Low back pain: Secondary | ICD-10-CM | POA: Insufficient documentation

## 2016-09-30 DIAGNOSIS — M549 Dorsalgia, unspecified: Secondary | ICD-10-CM

## 2016-09-30 LAB — URINALYSIS, MICROSCOPIC (REFLEX)

## 2016-09-30 LAB — URINALYSIS, ROUTINE W REFLEX MICROSCOPIC
BILIRUBIN URINE: NEGATIVE
Glucose, UA: NEGATIVE mg/dL
HGB URINE DIPSTICK: NEGATIVE
Ketones, ur: NEGATIVE mg/dL
Nitrite: POSITIVE — AB
PH: 6 (ref 5.0–8.0)
Protein, ur: NEGATIVE mg/dL
SPECIFIC GRAVITY, URINE: 1.015 (ref 1.005–1.030)

## 2016-09-30 MED ORDER — CEPHALEXIN 500 MG PO CAPS
500.0000 mg | ORAL_CAPSULE | Freq: Once | ORAL | Status: AC
  Administered 2016-10-01: 500 mg via ORAL
  Filled 2016-09-30: qty 1

## 2016-09-30 MED ORDER — CYCLOBENZAPRINE HCL 5 MG PO TABS
5.0000 mg | ORAL_TABLET | Freq: Once | ORAL | Status: AC
  Administered 2016-10-01: 5 mg via ORAL
  Filled 2016-09-30: qty 1

## 2016-09-30 NOTE — MAU Provider Note (Signed)
History     CSN: [id number]  Arrival date and time: 09/30/16 2043   First Provider Initiated Contact with Patient 09/30/16 2341 / assessment & exam done with assistance from [employer] video interpreter Angela Herring Interpreter # [phone]    Chief Complaint  Patient presents with  . Back Pain   HPI  Ms. Angela Herring is a 31 y.o. [id number] at [redacted]w[redacted]d gestation presenting to MAU with complaints of pink urine and lower back pain x 4 days.  She denies dysuria, VB or LOF.  She reports good FM today.  Past Medical History:  Diagnosis Date  . Medical history non-contributory   . Miscarriage   . No pertinent past medical history     Past Surgical History:  Procedure Laterality Date  . DILATION AND CURETTAGE OF UTERUS    . OOPHORECTOMY Right   . TUMOR REMOVAL Left    Left oophrectomy for benign tumor    History reviewed. No pertinent family history.  Social History  Substance Use Topics  . Smoking status: Never Smoker  . Smokeless tobacco: Never Used  . Alcohol use No    Allergies: No Known Allergies  Prescriptions Prior to Admission  Medication Sig Dispense Refill Last Dose  . Doxylamine-Pyridoxine (DICLEGIS) 10-10 MG TBEC Take 1 tablet with breakfast and lunch.  Take 2 tablets at bedtime. (Patient not taking: Reported on 08/07/2016) 100 tablet 4 Not Taking  . Prenat-FeAsp-Meth-FA-DHA w/o A (PRENATE PIXIE) 10-0.6-0.4-200 MG CAPS Take 1 tablet by mouth daily. 30 capsule 12 Taking  . Prenatal Vit-Fe Fumarate-FA (MULTIVITAMIN-PRENATAL) 27-0.8 MG TABS tablet Take 1 tablet by mouth daily at 12 noon.   Taking  . Prenatal-DSS-FeCb-FeGl-FA (CITRANATAL BLOOM) 90-1 MG TABS Take 1 tablet by mouth daily. (Patient not taking: Reported on 09/16/2016) 30 tablet 12 Not Taking  . Vitamin D, Ergocalciferol, (DRISDOL) 50000 units CAPS capsule Take 1 capsule (50,000 Units total) by mouth every 7 (seven) days. 30 capsule 2 Taking    Review of Systems  Constitutional: Negative.   HENT: Negative.   Eyes:  Negative.   Respiratory: Negative.   Cardiovascular: Negative.   Gastrointestinal: Negative.   Endocrine: Negative.   Genitourinary: Negative for vaginal bleeding.       "pink" urine  Musculoskeletal: Positive for back pain.  Skin: Negative.   Allergic/Immunologic: Negative.   Neurological: Negative.   Hematological: Negative.   Psychiatric/Behavioral: Negative.    Physical Exam   Blood pressure 111/70, pulse 78, temperature 98.2 F (36.8 C), temperature source Oral, resp. rate 20, last menstrual period 01/11/2016, unknown if currently breastfeeding.  Physical Exam  Constitutional: She is oriented to person, place, and time. She appears well-developed and well-nourished.  HENT:  Head: Normocephalic.  Eyes: Pupils are equal, round, and reactive to light.  Neck: Normal range of motion.  Cardiovascular: Normal rate, regular rhythm and normal heart sounds.   Respiratory: Effort normal and breath sounds normal.  GI: Soft. Bowel sounds are normal. There is CVA tenderness (mild).  Genitourinary:  Genitourinary Comments: Dilation: Closed Effacement (%): 50 Cervical Position: Posterior Station: -3 Presentation: Vertex Exam by:: Angela Herring CNM   Musculoskeletal: Normal range of motion.  Neurological: She is alert and oriented to person, place, and time.  Skin: Skin is warm and dry.  Psychiatric: She has a normal mood and affect. Her behavior is normal. Judgment and thought content normal.    MAU Course  Procedures  MDM CCUA UCx (OB) - pending NST - FHR: 135 bpm / moderate variability / accels present /  decels absent TOCO: irregular every 3-14 mins  Assessment and Plan  UTI (urinary tract infection) during pregnancy, third trimester - Rx for Keflex 500 mg PO QID x 7 days - Instructions on UTI given  Back Pain affecting third trimester of pregnancy - Advised to take Tylenol for pain  Discharge home Keep OB appt today Patient verbalized an understanding of the plan  of care and agrees.   Angela Moraolitta Dietra Stokely, MSN, CNM 09/30/2016, 11:41 PM

## 2016-09-30 NOTE — MAU Note (Signed)
Urine in lab 

## 2016-09-30 NOTE — MAU Note (Signed)
Pt presents with c/o frequent urination.  Pt reports urine is pink in color, denies dysuria or burning.  Pt also reports lower bilateral back pain x4 days

## 2016-10-01 ENCOUNTER — Encounter: Payer: Self-pay | Admitting: Certified Nurse Midwife

## 2016-10-01 ENCOUNTER — Ambulatory Visit (INDEPENDENT_AMBULATORY_CARE_PROVIDER_SITE_OTHER): Payer: Medicaid Other | Admitting: Certified Nurse Midwife

## 2016-10-01 VITALS — BP 99/65 | HR 80 | Wt 169.5 lb

## 2016-10-01 DIAGNOSIS — Z789 Other specified health status: Secondary | ICD-10-CM

## 2016-10-01 DIAGNOSIS — M545 Low back pain: Secondary | ICD-10-CM | POA: Diagnosis not present

## 2016-10-01 DIAGNOSIS — O2343 Unspecified infection of urinary tract in pregnancy, third trimester: Secondary | ICD-10-CM | POA: Diagnosis present

## 2016-10-01 DIAGNOSIS — O99013 Anemia complicating pregnancy, third trimester: Secondary | ICD-10-CM

## 2016-10-01 DIAGNOSIS — Z23 Encounter for immunization: Secondary | ICD-10-CM

## 2016-10-01 DIAGNOSIS — Z3483 Encounter for supervision of other normal pregnancy, third trimester: Secondary | ICD-10-CM

## 2016-10-01 DIAGNOSIS — Z3480 Encounter for supervision of other normal pregnancy, unspecified trimester: Secondary | ICD-10-CM

## 2016-10-01 DIAGNOSIS — R7989 Other specified abnormal findings of blood chemistry: Secondary | ICD-10-CM

## 2016-10-01 DIAGNOSIS — O9989 Other specified diseases and conditions complicating pregnancy, childbirth and the puerperium: Secondary | ICD-10-CM

## 2016-10-01 MED ORDER — CEPHALEXIN 500 MG PO CAPS: 500.0000 mg | ORAL_CAPSULE | Freq: Four times a day (QID) | ORAL | 0 refills | Status: DC

## 2016-10-01 NOTE — Discharge Instructions (Signed)
El ACETAMINOFENO Para dolor esta bien.

## 2016-10-01 NOTE — Progress Notes (Signed)
   PRENATAL VISIT NOTE  Subjective:  Angela Herring is a 31 y.o. [id number] at 4335w5d being seen today for ongoing prenatal care.  She is currently monitored for the following issues for this low-risk pregnancy and has Supervision of normal pregnancy, antepartum; Non-English speaking patient; Low vitamin D level; Anemia affecting pregnancy in third trimester; and UTI (urinary tract infection) during pregnancy, third trimester on her problem list.  Patient reports no complaints.  Contractions: Irregular. Vag. Bleeding: None.  Movement: Present. Denies leaking of fluid.   The following portions of the patient's history were reviewed and updated as appropriate: allergies, current medications, past family history, past medical history, past social history, past surgical history and problem list. Problem list updated.  Objective:   Vitals:   10/01/16 0924  BP: 99/65  Pulse: 80  Weight: 169 lb 8 oz (76.9 kg)    Fetal Status: Fetal Heart Rate (bpm): 145 Fundal Height: 37 cm Movement: Present     General:  Alert, oriented and cooperative. Patient is in no acute distress.  Skin: Skin is warm and dry. No rash noted.   Cardiovascular: Normal heart rate noted  Respiratory: Normal respiratory effort, no problems with respiration noted  Abdomen: Soft, gravid, appropriate for gestational age.  Pain/Pressure: Present     Pelvic: Cervical exam deferred        Extremities: Normal range of motion.  Edema: Trace  Mental Status:  Normal mood and affect. Normal behavior. Normal judgment and thought content.   Assessment and Plan:  Pregnancy: [id number] at 3535w5d  1. Supervision of other normal pregnancy, antepartum      Doing well.  - Flu Vaccine QUAD 36+ mos IM (Fluarix, Quad PF)  2. Non-English speaking patient     Video interpreter used.   3. Low vitamin D level     Taking weekly vitamin D.   4. Anemia affecting pregnancy in third trimester     Taking Bloom.   Term labor symptoms and general  obstetric precautions including but not limited to vaginal bleeding, contractions, leaking of fluid and fetal movement were reviewed in detail with the patient. Please refer to After Visit Summary for other counseling recommendations.  Return in about 1 week (around 10/08/2016) for ROB.   Roe Coombsachelle A Elsy Chiang, CNM

## 2016-10-02 LAB — CULTURE, OB URINE: Special Requests: NORMAL

## 2016-10-08 ENCOUNTER — Other Ambulatory Visit (HOSPITAL_COMMUNITY)
Admission: RE | Admit: 2016-10-08 | Discharge: 2016-10-08 | Disposition: A | Discharge status: Home / Self Care | Payer: Medicaid Other | Source: Ambulatory Visit | Attending: Certified Nurse Midwife | Admitting: Certified Nurse Midwife

## 2016-10-08 ENCOUNTER — Ambulatory Visit (INDEPENDENT_AMBULATORY_CARE_PROVIDER_SITE_OTHER): Payer: Medicaid Other | Admitting: Certified Nurse Midwife

## 2016-10-08 VITALS — BP 115/65 | HR 80 | Wt 170.7 lb

## 2016-10-08 DIAGNOSIS — R7989 Other specified abnormal findings of blood chemistry: Secondary | ICD-10-CM

## 2016-10-08 DIAGNOSIS — D649 Anemia, unspecified: Secondary | ICD-10-CM

## 2016-10-08 DIAGNOSIS — Z3490 Encounter for supervision of normal pregnancy, unspecified, unspecified trimester: Secondary | ICD-10-CM | POA: Diagnosis present

## 2016-10-08 DIAGNOSIS — O99013 Anemia complicating pregnancy, third trimester: Secondary | ICD-10-CM

## 2016-10-08 DIAGNOSIS — Z3A00 Weeks of gestation of pregnancy not specified: Secondary | ICD-10-CM | POA: Diagnosis not present

## 2016-10-08 DIAGNOSIS — Z23 Encounter for immunization: Secondary | ICD-10-CM

## 2016-10-08 DIAGNOSIS — Z3483 Encounter for supervision of other normal pregnancy, third trimester: Secondary | ICD-10-CM

## 2016-10-08 DIAGNOSIS — Z789 Other specified health status: Secondary | ICD-10-CM

## 2016-10-08 NOTE — Addendum Note (Signed)
Addended by: Marya Landry D on: 10/08/2016 05:05 PM   Modules accepted: Orders

## 2016-10-08 NOTE — Progress Notes (Signed)
   PRENATAL VISIT NOTE  Subjective:  Angela Herring is a 31 y.o. Z6X0960 at [redacted]w[redacted]d being seen today for ongoing prenatal care.  She is currently monitored for the following issues for this low-risk pregnancy and has Supervision of normal pregnancy, antepartum; Non-English speaking patient; Low vitamin D level; Anemia affecting pregnancy in third trimester; and UTI (urinary tract infection) during pregnancy, third trimester on her problem list.  Patient reports no bleeding, occasional contractions and increased vaginal discharge.  Contractions: Not present. Vag. Bleeding: None.  Movement: Present. Reports leaking of fluid. States that her last sexual intercourse was last night.  Has had soaking of underwear overnight.    The following portions of the patient's history were reviewed and updated as appropriate: allergies, current medications, past family history, past medical history, past social history, past surgical history and problem list. Problem list updated.  Objective:   Vitals:   10/08/16 0927  BP: 115/65  Pulse: 80  Weight: 170 lb 11.2 oz (77.4 kg)    Fetal Status: Fetal Heart Rate (bpm): 140; doppler Fundal Height: 35 cm Movement: Present  Presentation: Vertex  General:  Alert, oriented and cooperative. Patient is in no acute distress.  Skin: Skin is warm and dry. No rash noted.   Cardiovascular: Normal heart rate noted  Respiratory: Normal respiratory effort, no problems with respiration noted  Abdomen: Soft, gravid, appropriate for gestational age.  Pain/Pressure: Present     Pelvic: Cervical exam performed Dilation: Closed Effacement (%): 20 Station: -3  Extremities: Normal range of motion.  Edema: Trace  Mental Status:  Normal mood and affect. Normal behavior. Normal judgment and thought content.   Fern: negative  Nitrazine paper: positive Assessment and Plan:  Pregnancy: A5W0981 at [redacted]w[redacted]d  1. Encounter for supervision of normal pregnancy, antepartum, unspecified gravidity     Interval BTL paperwork completed today.    2. Non-English speaking patient     Video interpreter used.   3. Low vitamin D level     Taking vitamin D weekly  4. Anemia affecting pregnancy in third trimester     Taking Bloom.   Term labor symptoms and general obstetric precautions including but not limited to vaginal bleeding, contractions, leaking of fluid and fetal movement were reviewed in detail with the patient. Please refer to After Visit Summary for other counseling recommendations.  Return in about 1 week (around 10/15/2016) for ROB.   Roe Coombs, CNM

## 2016-10-08 NOTE — Progress Notes (Signed)
Spanish Interpreter: Silvestre Mesi [phone]. TDAP given. Tolerated well.

## 2016-10-10 ENCOUNTER — Inpatient Hospital Stay (HOSPITAL_COMMUNITY)
Admission: AD | Admit: 2016-10-10 | Discharge: 2016-10-10 | Disposition: A | Discharge status: Home / Self Care | Payer: Medicaid Other | Source: Ambulatory Visit | Attending: Obstetrics and Gynecology | Admitting: Obstetrics and Gynecology

## 2016-10-10 ENCOUNTER — Encounter (HOSPITAL_COMMUNITY): Payer: Self-pay

## 2016-10-10 DIAGNOSIS — Z3A39 39 weeks gestation of pregnancy: Secondary | ICD-10-CM | POA: Insufficient documentation

## 2016-10-10 DIAGNOSIS — O2343 Unspecified infection of urinary tract in pregnancy, third trimester: Secondary | ICD-10-CM

## 2016-10-10 DIAGNOSIS — O26893 Other specified pregnancy related conditions, third trimester: Secondary | ICD-10-CM | POA: Insufficient documentation

## 2016-10-10 DIAGNOSIS — M549 Dorsalgia, unspecified: Secondary | ICD-10-CM

## 2016-10-10 DIAGNOSIS — O471 False labor at or after 37 completed weeks of gestation: Secondary | ICD-10-CM

## 2016-10-10 DIAGNOSIS — O99891 Other specified diseases and conditions complicating pregnancy: Secondary | ICD-10-CM | POA: Diagnosis present

## 2016-10-10 DIAGNOSIS — O9989 Other specified diseases and conditions complicating pregnancy, childbirth and the puerperium: Secondary | ICD-10-CM

## 2016-10-10 LAB — POCT FERN TEST: POCT FERN TEST: NEGATIVE

## 2016-10-10 LAB — CERVICOVAGINAL ANCILLARY ONLY
Bacterial vaginitis: NEGATIVE
CANDIDA VAGINITIS: NEGATIVE

## 2016-10-10 MED ORDER — CYCLOBENZAPRINE HCL 10 MG PO TABS: 10.0000 mg | ORAL_TABLET | Freq: Two times a day (BID) | ORAL | 0 refills | Status: DC | PRN

## 2016-10-10 NOTE — MAU Provider Note (Signed)
History     CSN: [id number]  Arrival date and time: 10/10/16 0930   First Provider Initiated Contact with Patient 10/10/16 1015      Chief Complaint  Patient presents with  . Back Pain  . Leaking Fluid    x 2 days   HPI  Ms. Angela Herring is a 31 y.o. N8G9562 at [redacted]w[redacted]d gestation presenting to MAU with complaints of back pain and "wet underwear" x 2 days.  She has taken Tylenol without relief.  She called [facility] requesting a wkin appt, but was advised to come here for evaluation.  She rates her back pain a 3/10.  She reports (+) FM today.  Denies VB.  Past Medical History:  Diagnosis Date  . Medical history non-contributory   . Miscarriage   . No pertinent past medical history     Past Surgical History:  Procedure Laterality Date  . DILATION AND CURETTAGE OF UTERUS    . OOPHORECTOMY Right   . TUMOR REMOVAL Left    Left oophrectomy for benign tumor    History reviewed. No pertinent family history.  Social History  Substance Use Topics  . Smoking status: Never Smoker  . Smokeless tobacco: Never Used  . Alcohol use No    Allergies: No Known Allergies  Prescriptions Prior to Admission  Medication Sig Dispense Refill Last Dose  . cephALEXin (KEFLEX) 500 MG capsule Take 1 capsule (500 mg total) by mouth 4 (four) times daily. 28 capsule 0 Taking  . Doxylamine-Pyridoxine (DICLEGIS) 10-10 MG TBEC Take 1 tablet with breakfast and lunch.  Take 2 tablets at bedtime. (Patient not taking: Reported on 10/01/2016) 100 tablet 4 Not Taking  . Prenat-FeAsp-Meth-FA-DHA w/o A (PRENATE PIXIE) 10-0.6-0.4-200 MG CAPS Take 1 tablet by mouth daily. 30 capsule 12 Taking  . Prenatal Vit-Fe Fumarate-FA (MULTIVITAMIN-PRENATAL) 27-0.8 MG TABS tablet Take 1 tablet by mouth daily at 12 noon.   Taking  . Prenatal-DSS-FeCb-FeGl-FA (CITRANATAL BLOOM) 90-1 MG TABS Take 1 tablet by mouth daily. 30 tablet 12 Taking  . Vitamin D, Ergocalciferol, (DRISDOL) 50000 units CAPS capsule Take 1 capsule (50,000  Units total) by mouth every 7 (seven) days. 30 capsule 2 Taking    Review of Systems  Constitutional: Negative.   HENT: Negative.   Eyes: Negative.   Respiratory: Negative.   Cardiovascular: Negative.   Gastrointestinal: Negative.   Endocrine: Negative.   Genitourinary: Positive for vaginal discharge ("odorless fluid that causes wet underwear x 2 days").  Musculoskeletal: Positive for back pain.  Skin: Negative.   Allergic/Immunologic: Negative.   Neurological: Negative.   Hematological: Negative.   Psychiatric/Behavioral: Negative.    Physical Exam   Blood pressure 112/72, pulse 79, temperature 98.7 F (37.1 C), temperature source Oral, resp. rate 18, last menstrual period 01/11/2016, unknown if currently breastfeeding.  Physical Exam  Nursing note and vitals reviewed. Constitutional: She is oriented to person, place, and time. She appears well-developed and well-nourished.  HENT:  Head: Normocephalic.  Eyes: Pupils are equal, round, and reactive to light.  Neck: Normal range of motion.  Cardiovascular: Normal rate, regular rhythm and normal heart sounds.   Respiratory: Effort normal and breath sounds normal.  GI: Soft. Bowel sounds are normal.  Genitourinary:  Genitourinary Comments: Uterus: gravid, S=D, cx: smooth, pink, no lesions, scant amt of thin, white vaginal d/c, FT/50%/soft, no CMT or friability, no adnexal tenderness, no VB  Musculoskeletal: Normal range of motion.  Neurological: She is alert and oriented to person, place, and time.  Skin: Skin is  warm and dry.  Psychiatric: She has a normal mood and affect. Her behavior is normal. Judgment and thought content normal.    MAU Course  Procedures  MDM NST - FHR: 130 bpm / moderate variability / accels present / decels absent / TOCO: irregular every 3-15 mins Fern -- negative  Assessment and Plan  False labor after 37 weeks of gestation without delivery - Labor Precautions reviewed - Keep OB appt  9/25  Back pain affecting pregnancy in third trimester - Unable to take here d/t driving herself to hospital - Rx Flexeril 10 mg PO BID   Discharge home Patient verbalized an understanding of the plan of care and agrees.  Raelyn Mora, MSN, CNM 10/10/2016, 10:22 AM

## 2016-10-10 NOTE — MAU Note (Addendum)
Pain in the back for 2 days. Pain comes and goes. Tylenol does not relive the pain. Underwear gets wet before getting to the bathroom. Fluid has no odor. Called the doctors office and they recommended patient come here because they did not have any appointments available today. Was here last week for a bladder infection on 9/10.

## 2016-10-15 ENCOUNTER — Ambulatory Visit (INDEPENDENT_AMBULATORY_CARE_PROVIDER_SITE_OTHER): Payer: Medicaid Other | Admitting: Certified Nurse Midwife

## 2016-10-15 VITALS — Wt 173.7 lb

## 2016-10-15 DIAGNOSIS — Z789 Other specified health status: Secondary | ICD-10-CM

## 2016-10-15 DIAGNOSIS — R7989 Other specified abnormal findings of blood chemistry: Secondary | ICD-10-CM

## 2016-10-15 DIAGNOSIS — O2343 Unspecified infection of urinary tract in pregnancy, third trimester: Secondary | ICD-10-CM

## 2016-10-15 DIAGNOSIS — O99013 Anemia complicating pregnancy, third trimester: Secondary | ICD-10-CM

## 2016-10-15 DIAGNOSIS — Z3480 Encounter for supervision of other normal pregnancy, unspecified trimester: Secondary | ICD-10-CM

## 2016-10-15 DIAGNOSIS — E559 Vitamin D deficiency, unspecified: Secondary | ICD-10-CM

## 2016-10-15 NOTE — Progress Notes (Signed)
Patient reports good fetal movement with irregular contractions and pressure. 

## 2016-10-15 NOTE — Progress Notes (Signed)
   PRENATAL VISIT NOTE  Subjective:  Angela Herring is a 31 y.o. Z6X0960 at [redacted]w[redacted]d being seen today for ongoing prenatal care.  She is currently monitored for the following issues for this low-risk pregnancy and has Supervision of normal pregnancy, antepartum; Non-English speaking patient; Low vitamin D level; Anemia affecting pregnancy in third trimester; UTI (urinary tract infection) during pregnancy, third trimester; and Back pain affecting pregnancy on her problem list.  Patient reports no complaints.  Contractions: Irregular. Vag. Bleeding: None.  Movement: Present. Denies leaking of fluid.   The following portions of the patient's history were reviewed and updated as appropriate: allergies, current medications, past family history, past medical history, past social history, past surgical history and problem list. Problem list updated.  Objective:   Vitals:   10/15/16 0914  Weight: 173 lb 11.2 oz (78.8 kg)    Fetal Status:     Movement: Present  Presentation: Vertex  General:  Alert, oriented and cooperative. Patient is in no acute distress.  Skin: Skin is warm and dry. No rash noted.   Cardiovascular: Normal heart rate noted  Respiratory: Normal respiratory effort, no problems with respiration noted  Abdomen: Soft, gravid, appropriate for gestational age.  Pain/Pressure: Present     Pelvic: Cervical exam performed Dilation: Closed Effacement (%): Thick Station: -3  Extremities: Normal range of motion.  Edema: Trace  Mental Status:  Normal mood and affect. Normal behavior. Normal judgment and thought content.   Assessment and Plan:  Pregnancy: A5W0981 at [redacted]w[redacted]d  1. Supervision of other normal pregnancy, antepartum     Doing well.  Vertex on [place] in office.  Cervix unchanged from previous exam.   2. Low vitamin D level     Taking weekly vitamin D  3. Anemia affecting pregnancy in third trimester     Taking Bloom  4. UTI (urinary tract infection) during pregnancy, third  trimester     TOC negative  5. Non-English speaking patient     Video interpreter used.   Term labor symptoms and general obstetric precautions including but not limited to vaginal bleeding, contractions, leaking of fluid and fetal movement were reviewed in detail with the patient. Please refer to After Visit Summary for other counseling recommendations.  Return in about 1 week (around 10/22/2016) for ROB, NST.   Roe Coombs, CNM

## 2016-10-17 ENCOUNTER — Telehealth (HOSPITAL_COMMUNITY): Payer: Self-pay | Admitting: *Deleted

## 2016-10-17 ENCOUNTER — Encounter (HOSPITAL_COMMUNITY): Payer: Self-pay | Admitting: *Deleted

## 2016-10-17 NOTE — Telephone Encounter (Signed)
Preadmission screen Interpreter number [phone] 

## 2016-10-18 ENCOUNTER — Other Ambulatory Visit: Payer: Self-pay | Admitting: Family Medicine

## 2016-10-20 ENCOUNTER — Inpatient Hospital Stay (HOSPITAL_COMMUNITY): Payer: Medicaid Other

## 2016-10-20 ENCOUNTER — Inpatient Hospital Stay (HOSPITAL_COMMUNITY)
Admission: AD | Admit: 2016-10-20 | Discharge: 2016-10-22 | DRG: 807 | Disposition: A | Discharge status: Home / Self Care | Payer: Medicaid Other | Source: Ambulatory Visit | Attending: Family Medicine | Admitting: Family Medicine

## 2016-10-20 ENCOUNTER — Encounter (HOSPITAL_COMMUNITY): Payer: Self-pay | Admitting: *Deleted

## 2016-10-20 DIAGNOSIS — O4690 Antepartum hemorrhage, unspecified, unspecified trimester: Secondary | ICD-10-CM

## 2016-10-20 DIAGNOSIS — O480 Post-term pregnancy: Secondary | ICD-10-CM | POA: Diagnosis present

## 2016-10-20 DIAGNOSIS — D649 Anemia, unspecified: Secondary | ICD-10-CM | POA: Diagnosis present

## 2016-10-20 DIAGNOSIS — Z3A40 40 weeks gestation of pregnancy: Secondary | ICD-10-CM

## 2016-10-20 DIAGNOSIS — O9902 Anemia complicating childbirth: Secondary | ICD-10-CM | POA: Diagnosis present

## 2016-10-20 DIAGNOSIS — O368130 Decreased fetal movements, third trimester, not applicable or unspecified: Secondary | ICD-10-CM | POA: Diagnosis present

## 2016-10-20 DIAGNOSIS — O26853 Spotting complicating pregnancy, third trimester: Secondary | ICD-10-CM | POA: Diagnosis present

## 2016-10-20 LAB — CBC
HEMATOCRIT: 33.2 % — AB (ref 36.0–46.0)
Hemoglobin: 11.1 g/dL — ABNORMAL LOW (ref 12.0–15.0)
MCH: 28.7 pg (ref 26.0–34.0)
MCHC: 33.4 g/dL (ref 30.0–36.0)
MCV: 85.8 fL (ref 78.0–100.0)
Platelets: 178 10*3/uL (ref 150–400)
RBC: 3.87 MIL/uL (ref 3.87–5.11)
RDW: 14.9 % (ref 11.5–15.5)
WBC: 8.7 10*3/uL (ref 4.0–10.5)

## 2016-10-20 LAB — URINALYSIS, ROUTINE W REFLEX MICROSCOPIC
BILIRUBIN URINE: NEGATIVE
Glucose, UA: NEGATIVE mg/dL
KETONES UR: NEGATIVE mg/dL
LEUKOCYTES UA: NEGATIVE
NITRITE: NEGATIVE
PH: 6 (ref 5.0–8.0)
Protein, ur: NEGATIVE mg/dL
SPECIFIC GRAVITY, URINE: 1.011 (ref 1.005–1.030)

## 2016-10-20 LAB — TYPE AND SCREEN
ABO/RH(D): A POS
ANTIBODY SCREEN: NEGATIVE

## 2016-10-20 LAB — WET PREP, GENITAL
Clue Cells Wet Prep HPF POC: NONE SEEN
Sperm: NONE SEEN
Trich, Wet Prep: NONE SEEN
YEAST WET PREP: NONE SEEN

## 2016-10-20 MED ORDER — SOD CITRATE-CITRIC ACID 500-334 MG/5ML PO SOLN: 30.0000 mL | ORAL | Status: DC | PRN

## 2016-10-20 MED ORDER — MISOPROSTOL 50MCG HALF TABLET
50.0000 ug | ORAL_TABLET | ORAL | Status: DC | PRN
  Administered 2016-10-20: 50 ug via ORAL
  Filled 2016-10-20: qty 1

## 2016-10-20 MED ORDER — FENTANYL CITRATE (PF) 100 MCG/2ML IJ SOLN: 100.0000 ug | INTRAMUSCULAR | Status: DC | PRN

## 2016-10-20 MED ORDER — LIDOCAINE HCL (PF) 1 % IJ SOLN
30.0000 mL | INTRAMUSCULAR | Status: DC | PRN
  Filled 2016-10-20: qty 30

## 2016-10-20 MED ORDER — OXYCODONE-ACETAMINOPHEN 5-325 MG PO TABS: 1.0000 | ORAL_TABLET | ORAL | Status: DC | PRN

## 2016-10-20 MED ORDER — TERBUTALINE SULFATE 1 MG/ML IJ SOLN: 0.2500 mg | Freq: Once | INTRAMUSCULAR | Status: DC | PRN

## 2016-10-20 MED ORDER — FLEET ENEMA 7-19 GM/118ML RE ENEM: 1.0000 | ENEMA | RECTAL | Status: DC | PRN

## 2016-10-20 MED ORDER — LACTATED RINGERS IV SOLN: INTRAVENOUS | Status: DC

## 2016-10-20 MED ORDER — FENTANYL CITRATE (PF) 100 MCG/2ML IJ SOLN
100.0000 ug | INTRAMUSCULAR | Status: DC | PRN
  Administered 2016-10-20: 100 ug via INTRAVENOUS
  Filled 2016-10-20: qty 2

## 2016-10-20 MED ORDER — ACETAMINOPHEN 325 MG PO TABS: 650.0000 mg | ORAL_TABLET | ORAL | Status: DC | PRN

## 2016-10-20 MED ORDER — LIDOCAINE HCL (PF) 1 % IJ SOLN: 30.0000 mL | INTRAMUSCULAR | Status: DC | PRN

## 2016-10-20 MED ORDER — LACTATED RINGERS IV SOLN
INTRAVENOUS | Status: DC
  Administered 2016-10-20 (×2): via INTRAVENOUS

## 2016-10-20 MED ORDER — LACTATED RINGERS IV SOLN: 500.0000 mL | INTRAVENOUS | Status: DC | PRN

## 2016-10-20 MED ORDER — OXYTOCIN 40 UNITS IN LACTATED RINGERS INFUSION - SIMPLE MED
2.5000 [IU]/h | INTRAVENOUS | Status: DC
  Administered 2016-10-21: 2.5 [IU]/h via INTRAVENOUS
  Filled 2016-10-20: qty 1000

## 2016-10-20 MED ORDER — OXYTOCIN 40 UNITS IN LACTATED RINGERS INFUSION - SIMPLE MED: 2.5000 [IU]/h | INTRAVENOUS | Status: DC

## 2016-10-20 MED ORDER — ONDANSETRON HCL 4 MG/2ML IJ SOLN: 4.0000 mg | Freq: Four times a day (QID) | INTRAMUSCULAR | Status: DC | PRN

## 2016-10-20 MED ORDER — OXYTOCIN BOLUS FROM INFUSION
500.0000 mL | Freq: Once | INTRAVENOUS | Status: AC
  Administered 2016-10-21: 500 mL via INTRAVENOUS

## 2016-10-20 MED ORDER — OXYCODONE-ACETAMINOPHEN 5-325 MG PO TABS: 2.0000 | ORAL_TABLET | ORAL | Status: DC | PRN

## 2016-10-20 MED ORDER — OXYTOCIN BOLUS FROM INFUSION: 500.0000 mL | Freq: Once | INTRAVENOUS | Status: DC

## 2016-10-20 NOTE — Progress Notes (Signed)
Labor Progress Note  Angela Herring is a 31 y.o. L2G4010 at [redacted]w[redacted]d  admitted for induction due to vaginal bleeding, decreased FM, and postdates  S: Patient comfortable. No concerns.   O:  BP 109/73   Pulse 69   Temp 98.2 F (36.8 C) (Oral)   Resp 18   Ht  (1.651 m)   Wt 78.5 kg (173 lb)   LMP 01/11/2016   BMI 28.79 kg/m   No intake/output data recorded.  FHT:  FHR: 135 bpm, variability: moderate,  accelerations:  Present,  decelerations:  Absent UC:   irregular, every 2-9 minutes SVE:   Dilation: 1 Effacement (%): Thick Station: Ballotable Exam by:: An Schnabel,DO Intact  Labs: Lab Results  Component Value Date   WBC 8.7 10/20/2016   HGB 11.1 (L) 10/20/2016   HCT 33.2 (L) 10/20/2016   MCV 85.8 10/20/2016   PLT 178 10/20/2016    Assessment / Plan: 31 y.o. [id number] [redacted]w[redacted]d. Not in labor Induction of labor due to postterm  Labor: Induction with cytotec and FB Fetal Wellbeing:  Category I Pain Control:  Labor support without medications Anticipated MOD:  NSVD  Expectant management   Caryl Ada, DO OB Fellow

## 2016-10-20 NOTE — H&P (Signed)
History   CSN: [id number]  Arrival date and time: 10/20/16 1214   First Provider Initiated Contact with Patient 10/20/16 1306         Chief Complaint  Patient presents with  . Back Pain  . Decreased Fetal Movement  . Vaginal Bleeding   31 yo G4P2012 at [redacted]w[redacted]d presents for light vaginal spotting this morning, not enough to fill a pad. Patient denies loss of fluid or contractions. She had low back pain that has been present for a few weeks, recently treated for UTI for same symptoms. Reviewed patient's previous ultrasound and did not show placenta previa. Patient has had an uncomplicated pregnancy. Dating by LMP. Receives care at [facility].   19 week ultrasound- normal Genetic screening- normal          OB History    Gravida Para Term Preterm AB Living   SAB TAB Ectopic Multiple Live Births   1       2          Past Medical History:  Diagnosis Date  . Medical history non-contributory   . Miscarriage   . No pertinent past medical history   . Ovarian tumor (benign), left    removed         Past Surgical History:  Procedure Laterality Date  . DILATION AND CURETTAGE OF UTERUS    . OOPHORECTOMY Right   . TUMOR REMOVAL Left    Left oophrectomy for benign tumor    History reviewed. No pertinent family history.      Social History  Substance Use Topics  . Smoking status: Never Smoker  . Smokeless tobacco: Never Used  . Alcohol use No    Allergies: No Known Allergies         Prescriptions Prior to Admission  Medication Sig Dispense Refill Last Dose  . acetaminophen (TYLENOL) 500 MG tablet Take 500 mg by mouth every 6 (six) hours as needed for moderate pain or headache.   Not Taking  . cephALEXin (KEFLEX) 500 MG capsule Take 1 capsule (500 mg total) by mouth 4 (four) times daily. 28 capsule 0 Taking  . cyclobenzaprine (FLEXERIL) 10 MG tablet Take 1 tablet (10 mg total) by mouth 2 (two) times daily as needed for muscle  spasms. 20 tablet 0 Taking  . Doxylamine-Pyridoxine (DICLEGIS) 10-10 MG TBEC Take 1 tablet with breakfast and lunch.  Take 2 tablets at bedtime. (Patient not taking: Reported on 10/01/2016) 100 tablet 4 Not Taking  . Prenatal Vit-Fe Fumarate-FA (MULTIVITAMIN-PRENATAL) 27-0.8 MG TABS tablet Take 1 tablet by mouth daily at 12 noon.   Taking  . Vitamin D, Ergocalciferol, (DRISDOL) 50000 units CAPS capsule Take 1 capsule (50,000 Units total) by mouth every 7 (seven) days. 30 capsule 2 Taking    Review of Systems  Constitutional: Negative for fatigue and fever.  HENT: Negative for congestion and ear pain.   Eyes: Negative for discharge and itching.  Respiratory: Negative for apnea and chest tightness.   Cardiovascular: Negative for chest pain and palpitations.  Gastrointestinal: Negative for abdominal pain, nausea and vomiting.  Endocrine: Negative for cold intolerance and heat intolerance.  Genitourinary: Positive for vaginal bleeding.  Musculoskeletal: Positive for back pain.  Neurological: Negative for dizziness and facial asymmetry.   Physical Exam   Blood pressure 103/66, pulse 96, temperature 98.3 F (36.8 C), temperature source Oral, resp. rate 20, last menstrual period 01/11/2016, unknown if currently breastfeeding.  Physical Exam  Constitutional:  She is oriented to person, place, and time. She appears well-developed and well-nourished. No distress.  HENT:  Head: Normocephalic and atraumatic.  Eyes: Pupils are equal, round, and reactive to light. Conjunctivae are normal.  Neck: Normal range of motion. Neck supple.  Cardiovascular: Normal rate and regular rhythm.   Respiratory: Effort normal and breath sounds normal. No respiratory distress.  GI: Soft. Bowel sounds are normal. There is no tenderness.  Genitourinary: Vagina normal.  Genitourinary Comments: Cervix: 1/thick/-3 Blood in vaginal vault. No active bleeding from cervix. No lesions or mass on cervix of vagina   Musculoskeletal: Normal range of motion. She exhibits no edema.  Neurological: She is alert and oriented to person, place, and time.  Skin: Skin is warm and dry.  Psychiatric: She has a normal mood and affect. Her behavior is normal.     Assessment and Plan  1. Pregnancy at 40 weeks- admit for induction of labor #labor- cervix unfavorable. Will start cytotec. #MOC- POP #MOF- breast/bottle #ID- GBS neg. No antibiotics indicated  [employer], DO 10/20/2016, 1:43 PM

## 2016-10-20 NOTE — MAU Note (Signed)
Urine in lab 

## 2016-10-20 NOTE — MAU Note (Addendum)
Pt noted a small amount of vaginal bleeding this morning, not enough to cover a pad.  Bleeding has stopped now.  Has had back pain today, relieved by Tylenol.  Noted less fetal movement yesterday.

## 2016-10-21 ENCOUNTER — Inpatient Hospital Stay (HOSPITAL_COMMUNITY): Payer: Medicaid Other | Admitting: Anesthesiology

## 2016-10-21 ENCOUNTER — Encounter (HOSPITAL_COMMUNITY): Payer: Self-pay

## 2016-10-21 DIAGNOSIS — Z3A40 40 weeks gestation of pregnancy: Secondary | ICD-10-CM

## 2016-10-21 LAB — RPR: RPR: NONREACTIVE

## 2016-10-21 LAB — GC/CHLAMYDIA PROBE AMP (~~LOC~~) NOT AT ARMC
Chlamydia: NEGATIVE
Neisseria Gonorrhea: NEGATIVE

## 2016-10-21 MED ORDER — BENZOCAINE-MENTHOL 20-0.5 % EX AERO: 1.0000 "application " | INHALATION_SPRAY | CUTANEOUS | Status: DC | PRN

## 2016-10-21 MED ORDER — PHENYLEPHRINE 40 MCG/ML (10ML) SYRINGE FOR IV PUSH (FOR BLOOD PRESSURE SUPPORT)
80.0000 ug | PREFILLED_SYRINGE | INTRAVENOUS | Status: DC | PRN
  Filled 2016-10-21: qty 10

## 2016-10-21 MED ORDER — DIPHENHYDRAMINE HCL 25 MG PO CAPS: 25.0000 mg | ORAL_CAPSULE | Freq: Four times a day (QID) | ORAL | Status: DC | PRN

## 2016-10-21 MED ORDER — IBUPROFEN 600 MG PO TABS
600.0000 mg | ORAL_TABLET | Freq: Four times a day (QID) | ORAL | Status: DC
  Administered 2016-10-21 – 2016-10-22 (×5): 600 mg via ORAL
  Filled 2016-10-21 (×5): qty 1

## 2016-10-21 MED ORDER — ZOLPIDEM TARTRATE 5 MG PO TABS: 5.0000 mg | ORAL_TABLET | Freq: Every evening | ORAL | Status: DC | PRN

## 2016-10-21 MED ORDER — DIBUCAINE 1 % RE OINT: 1.0000 "application " | TOPICAL_OINTMENT | RECTAL | Status: DC | PRN

## 2016-10-21 MED ORDER — DIPHENHYDRAMINE HCL 50 MG/ML IJ SOLN: 12.5000 mg | INTRAMUSCULAR | Status: DC | PRN

## 2016-10-21 MED ORDER — EPHEDRINE 5 MG/ML INJ: 10.0000 mg | INTRAVENOUS | Status: DC | PRN

## 2016-10-21 MED ORDER — TERBUTALINE SULFATE 1 MG/ML IJ SOLN: 0.2500 mg | Freq: Once | INTRAMUSCULAR | Status: DC | PRN

## 2016-10-21 MED ORDER — ACETAMINOPHEN 325 MG PO TABS
650.0000 mg | ORAL_TABLET | ORAL | Status: DC | PRN
Start: 2016-10-21 — End: 2016-10-22

## 2016-10-21 MED ORDER — PHENYLEPHRINE 40 MCG/ML (10ML) SYRINGE FOR IV PUSH (FOR BLOOD PRESSURE SUPPORT): 80.0000 ug | PREFILLED_SYRINGE | INTRAVENOUS | Status: DC | PRN

## 2016-10-21 MED ORDER — TETANUS-DIPHTH-ACELL PERTUSSIS 5-2.5-18.5 LF-MCG/0.5 IM SUSP: 0.5000 mL | Freq: Once | INTRAMUSCULAR | Status: DC

## 2016-10-21 MED ORDER — OXYTOCIN 40 UNITS IN LACTATED RINGERS INFUSION - SIMPLE MED
1.0000 m[IU]/min | INTRAVENOUS | Status: DC
  Administered 2016-10-21: 2 m[IU]/min via INTRAVENOUS

## 2016-10-21 MED ORDER — LACTATED RINGERS IV SOLN: 500.0000 mL | Freq: Once | INTRAVENOUS | Status: DC

## 2016-10-21 MED ORDER — SENNOSIDES-DOCUSATE SODIUM 8.6-50 MG PO TABS
2.0000 | ORAL_TABLET | ORAL | Status: DC
  Administered 2016-10-22: 2 via ORAL
  Filled 2016-10-21: qty 2

## 2016-10-21 MED ORDER — ONDANSETRON HCL 4 MG/2ML IJ SOLN: 4.0000 mg | INTRAMUSCULAR | Status: DC | PRN

## 2016-10-21 MED ORDER — SIMETHICONE 80 MG PO CHEW: 80.0000 mg | CHEWABLE_TABLET | ORAL | Status: DC | PRN

## 2016-10-21 MED ORDER — ONDANSETRON HCL 4 MG PO TABS: 4.0000 mg | ORAL_TABLET | ORAL | Status: DC | PRN

## 2016-10-21 MED ORDER — FENTANYL CITRATE (PF) 100 MCG/2ML IJ SOLN: 100.0000 ug | INTRAMUSCULAR | Status: DC | PRN

## 2016-10-21 MED ORDER — FENTANYL 2.5 MCG/ML BUPIVACAINE 1/10 % EPIDURAL INFUSION (WH - ANES)
14.0000 mL/h | INTRAMUSCULAR | Status: DC | PRN
  Administered 2016-10-21: 12 mL/h via EPIDURAL
  Filled 2016-10-21: qty 100

## 2016-10-21 MED ORDER — LIDOCAINE HCL (PF) 1 % IJ SOLN
INTRAMUSCULAR | Status: DC | PRN
  Administered 2016-10-21 (×2): 6 mL via EPIDURAL

## 2016-10-21 MED ORDER — COCONUT OIL OIL: 1.0000 "application " | TOPICAL_OIL | Status: DC | PRN

## 2016-10-21 MED ORDER — PRENATAL MULTIVITAMIN CH
1.0000 | ORAL_TABLET | Freq: Every day | ORAL | Status: DC
  Administered 2016-10-21: 1 via ORAL
  Filled 2016-10-21: qty 1

## 2016-10-21 MED ORDER — WITCH HAZEL-GLYCERIN EX PADS: 1.0000 "application " | MEDICATED_PAD | CUTANEOUS | Status: DC | PRN

## 2016-10-21 NOTE — Progress Notes (Signed)
Patient wishes for her sister to order her meals. Eda H Royal Interpreter.

## 2016-10-21 NOTE — Plan of Care (Signed)
Problem: Nutritional: Goal: Mothers verbalization of comfort with breastfeeding process will improve Outcome: Completed/Met Date Met: 10/21/16 Mom is an experienced breast feeder who is comfortable with feeding, states she has had no issues with other children and has not questions or issues with feeding this child

## 2016-10-21 NOTE — Lactation Note (Signed)
This note was copied from a baby's chart. Lactation Consultation Note  Patient Name: Boy Kamaya Keckler [id number] Date: 10/21/2016 Reason for consult: Initial assessment  Initial visit at 10 hours of life. Mom is a P3 who nursed her 1st child for 2 months & her 2nd child for 2 years. Mom said she only nursed for 2 months the 1st time b/c she had gotten sick & at that time, she had been told that she could not breastfeed her baby if she was ill. Mom stopped nursing for 3 days and then her milk dried up.  With Mom's 2nd baby, lactation history significant for getting an abscess in her left? breast. Mom said this was b/c her baby had begun eating solid foods & wasn't drinking enough of her breast milk. Mom had it aspirated at the mammography center.   Mom's feeding choice for while in the hospital is breast milk only. Mom was educated about initial, sleepy newborn behavior.   Since Mom has [place], Mom was provided a pump kit. Mom was shown how to assemble, wash, & use hand pump that was included in the kit. I emphasized, though, that she does not need to start doing any pumping at this time.  Angelica Chessman, interpreter, present for entire consult.   Matthias Hughs [facility] 10/21/2016, 4:18 PM

## 2016-10-21 NOTE — Anesthesia Procedure Notes (Signed)
Epidural Patient location during procedure: OB Start time: 10/21/2016 2:35 AM End time: 10/21/2016 3:07 AM  Staffing Anesthesiologist: Jairo Ben Performed: anesthesiologist   Preanesthetic Checklist Completed: patient identified, surgical consent, pre-op evaluation, timeout performed, IV checked, risks and benefits discussed and monitors and equipment checked  Epidural Patient position: sitting Prep: site prepped and draped and DuraPrep Patient monitoring: blood pressure, continuous pulse ox and heart rate Approach: midline Location: L2-L3 Injection technique: LOR air  Needle:  Needle type: Tuohy  Needle gauge: 17 G Needle length: 9 cm Needle insertion depth: 4 cm Catheter type: closed end flexible Catheter size: 19 Gauge Catheter at skin depth: 9 cm Test dose: negative (1% lidocaine)  Assessment Events: blood not aspirated, injection not painful, no injection resistance, negative IV test and no paresthesia  Additional Notes Pt identified in Labor room.  Monitors applied. Working IV access confirmed. Sterile prep, drape lumbar spine.  1% lido local L 2,3.  #17ga Touhy LOR air at 4 cm L 2,3, cath in easily to 9 cm skin. Test dose OK, cath dosed and infusion begun.  Patient asymptomatic, VSS, no heme aspirated, tolerated well.  Sandford Craze, MDReason for block:procedure for pain

## 2016-10-21 NOTE — Anesthesia Postprocedure Evaluation (Signed)
Anesthesia Post Note  Patient: Angela Herring  Procedure(s) Performed: AN AD HOC LABOR EPIDURAL     Patient location during evaluation: Mother Baby Anesthesia Type: Epidural Level of consciousness: awake Pain management: satisfactory to patient Vital Signs Assessment: post-procedure vital signs reviewed and stable Respiratory status: spontaneous breathing Cardiovascular status: stable Anesthetic complications: no    Last Vitals:  Vitals:   10/21/16 1408 10/21/16 1759  BP: (!) 109/51 101/61  Pulse: 75 91  Resp: 18 18  Temp: 36.6 C 37 C    Last Pain:  Vitals:   10/21/16 1801  TempSrc:   PainSc: 0-No pain   Pain Goal: Patients Stated Pain Goal: 8 (10/20/16 1752)               Cephus Shelling

## 2016-10-21 NOTE — Progress Notes (Signed)
Labor Progress Note  Angela Herring is a 31 y.o. [id number] at [redacted]w[redacted]d  admitted for induction due to vaginal bleeding, decreased FM, and postdates  S: No concerns.   O:  BP 101/82   Pulse 92   Temp 98.5 F (36.9 C) (Oral)   Resp 16   Ht  (1.651 m)   Wt 78.5 kg (173 lb)   LMP 01/11/2016   BMI 28.79 kg/m   No intake/output data recorded.  FHT:  FHR: 135 bpm, variability: moderate,  accelerations:  Present,  decelerations:  Absent UC:   irregular, every 1-7 minutes SVE:   Dilation: 6 Effacement (%): 70 Station: -2 Exam by:: Sade Harper,RN Intact  Labs: Lab Results  Component Value Date   WBC 8.7 10/20/2016   HGB 11.1 (L) 10/20/2016   HCT 33.2 (L) 10/20/2016   MCV 85.8 10/20/2016   PLT 178 10/20/2016    Assessment / Plan: 31 y.o. [id number] [redacted]w[redacted]d. Not in labor Induction of labor due to postterm  Labor: Fb now out, will start Pitocin Fetal Wellbeing:  Category I Pain Control:  Labor support without medications Anticipated MOD:  NSVD  Expectant management   Caryl Ada, DO OB Fellow

## 2016-10-21 NOTE — Progress Notes (Signed)
I assisted RN Kim from Lactation Department with some questions and explanation, by Orlan Leavens Spanish Interpreter.

## 2016-10-21 NOTE — Anesthesia Preprocedure Evaluation (Addendum)
Anesthesia Evaluation  Patient identified by MRN, date of birth, ID band Patient awake    Reviewed: Allergy & Precautions, NPO status , Patient's Chart, lab work & pertinent test results  History of Anesthesia Complications Negative for: history of anesthetic complications  Airway Mallampati: III  TM Distance: >3 FB Neck ROM: Full    Dental  (+) Dental Advisory Given   Pulmonary neg pulmonary ROS,    breath sounds clear to auscultation       Cardiovascular negative cardio ROS   Rhythm:Regular Rate:Normal     Neuro/Psych negative neurological ROS     GI/Hepatic Neg liver ROS, GERD  ,  Endo/Other  negative endocrine ROS  Renal/GU negative Renal ROS     Musculoskeletal   Abdominal   Peds  Hematology plt 178k   Anesthesia Other Findings   Reproductive/Obstetrics (+) Pregnancy                            Anesthesia Physical Anesthesia Plan  ASA: II  Anesthesia Plan: Epidural   Post-op Pain Management:    Induction:   PONV Risk Score and Plan: Treatment may vary due to age or medical condition  Airway Management Planned: Natural Airway  Additional Equipment:   Intra-op Plan:   Post-operative Plan:   Informed Consent: I have reviewed the patients History and Physical, chart, labs and discussed the procedure including the risks, benefits and alternatives for the proposed anesthesia with the patient or authorized representative who has indicated his/her understanding and acceptance.   Dental advisory given  Plan Discussed with:   Anesthesia Plan Comments: (Patient identified. Risks/Benefits/Options discussed with patient including but not limited to bleeding, infection, nerve damage, paralysis, failed block, incomplete pain control, headache, blood pressure changes, nausea, vomiting, reactions to medication both or allergic, itching and postpartum back pain. Confirmed with bedside  nurse the patient's most recent platelet count. Confirmed with patient that they are not currently taking any anticoagulation, have any bleeding history or any family history of bleeding disorders. Patient expressed understanding and wished to proceed. All questions were answered. )        Anesthesia Quick Evaluation

## 2016-10-22 MED ORDER — NORETHINDRONE 0.35 MG PO TABS: 1.0000 | ORAL_TABLET | Freq: Every day | ORAL | 11 refills | Status: DC

## 2016-10-22 NOTE — Discharge Instructions (Signed)
Instrucciones para la mamá sobre los cuidados en el hogar °(Home Care Instructions for Mom) °ACTIVIDAD °· Reanude sus actividades regulares de forma gradual. °· Descanse. Tome siestas cuando el bebé duerme. °· No levante objetos que pesen más de 10 libras (4,5 kg) hasta que el médico se lo autorice. °· Evite las actividades que demandan mucho esfuerzo y energía (que son extenuantes) hasta que el médico se lo autorice. Caminar a un ritmo tranquilo a moderado siempre es más seguro. °· Si tuvo un parto por cesárea: °? No pase la aspiradora, suba escaleras o conduzca un vehículo durante 4 o 6 semanas. °? Pídale a alguien que le brinde ayuda con las tareas domésticas hasta que pueda realizarlas por su cuenta. °? Haga ejercicios como se lo haya indicado el médico, si corresponde. ° °HEMORRAGIA VAGINAL °Probablemente continúe sangrando durante 4 o 6 semanas después del parto. Generalmente, la cantidad de sangre disminuye y el color se hace más claro con el transcurso del tiempo. Sin embargo, si usted está demasiado activa, el color de la sangre puede ser rojo brillante. Si necesita cambiarse la compresa higiénica en menos de una hora o tiene coágulos grandes: °· Permanezca acostada. °· Eleve los pies. °· Coloque compresas frías en la zona inferior del abdomen. °· Haga reposo. °· Comuníquese con su médico. °Si está amamantando, podría volver a tener su período entre las 8 semanas después del parto y el momento en que deje de amamantar. Si no está amamantando, volverá a tener su período 6 u 8 semanas después del parto. °CUIDADOS PERINEALES °La zona perineal o perineo, es la parte del cuerpo que se encuentra entre los muslos. Después del parto, esta zona necesita un cuidado especial. Siga las siguientes indicaciones como se lo haya indicado su médico. °· Tome baños de inmersión durante 15 o 20 minutos. °· Utilice apósitos o aerosoles analgésicos y cremas como se lo hayan indicado. °· No utilice tampones ni se haga duchas  vaginales hasta que el sangrado vaginal se haya detenido. °· Cada vez que vaya al baño: °? Use una botella perineal. °? Cámbiese el apósito. °? Use papel tisú en lugar de papel higiénico hasta que se cure la sutura. °· Haga ejercicios de Kegel todos los días. Los ejercicios Kegel ayudan a mantener los músculos que sostienen la vagina, la vejiga y los intestinos. Estos ejercicios se pueden realizar mientras está parada, sentada o acostada. Para hacer los ejercicios de Kegel: °? Tense los músculos del estómago y los que rodean el canal de parto. °? Mantenga esta posición durante unos segundos. °? Relájese. °? Repita hasta hacerlos 5 veces seguidas. °· Para evitar las hemorroides o que estas empeoren: °? Beba suficiente líquido para mantener la orina clara o de color amarillo pálido. °? Evite hacer fuerza al defecar. °? Tome los medicamentos y laxantes de venta libre como se lo haya indicado el médico. °CUIDADO DE LAS MAMAS °· Use un buen sostén. °· Evite tomar analgésicos de venta libre para las molestias de los pechos. °· Aplique hielo en los pechos para aliviar las molestias tanto como sea necesario: °? Ponga el hielo en una bolsa plástica. °? Coloque una toalla entre la piel y la bolsa de hielo. °? Aplique el hielo durante 20, o como se lo haya indicado el médico. ° °NUTRICIÓN °· Mantenga una dieta bien balanceada. °· No intente perder de peso rápidamente reduciendo el consumo de calorías. °· Tome sus vitaminas prenatales hasta el control de postparto o hasta que su médico se lo indique. ° °DEPRESIÓN POSTPARTO °  Puede sentir deseos de llorar sin motivo aparente y verse incapaz de enfrentarse a todos los cambios que implica tener un bebé. Este estado de ánimo se llama depresión postparto. La depresión postparto ocurre porque sus niveles hormonales sufren cambios después del parto. Si usted tiene depresión postparto, busque contención por parte de su pareja, sus amigos y su familia. Si la depresión no desaparece por  sí sola después de algunas semanas, concurra a su médico. °AUTOEXAMEN DE MAMAS °Realícese autoexámenes en el mismo momento cada mes. Si está amamantando, el mejor momento de controlar sus mamas es después de alimentar al bebé, cuando los pechos no están tan llenos. Si está amamantando y su período ya comenzó, controle sus mamas el día 5, 6 o 7 de su período. °Informe a su médico de cualquier protuberancia, bulto o secreción. Si está amamantando, las mamas normalmente tienen bultos. Esto es transitorio y no es un riesgo para la salud. °INTIMIDAD Y SEXUALIDAD °Debe evitar las relaciones sexuales durante al menos 3 o 4 semanas después del parto o hasta que el flujo de color rojo amarronado haya desaparecido completamente. Si no desea quedar embarazada nuevamente, use algún método anticonceptivo. Después del parto, puede quedar embarazada incluso si no ha tenido todavía el período. °SOLICITE ATENCIÓN MÉDICA SI: °· Se siente incapaz de controlar los cambios que implica tener un hijo y esos sentimientos no desaparecen después de algunas semanas. °· Detecta una protuberancia, bulto o secreción en sus mamas. ° °SOLICITE ATENCIÓN MÉDICA DE INMEDIATO SI: °· Debe cambiarse la compresa higiénica en 1 hora o menos. °· Tiene los siguientes síntomas: °? Dolor intenso o calambres en la parte inferior del abdomen. °? Una secreción vaginal con mal olor. °? Fiebre que no se alivia con los medicamentos. °? Una zona de la mama se pone roja y le causa dolor, y además usted tiene fiebre. °? Una pantorrilla enrojecida y con dolor. °? Repentino e intenso dolor en el pecho. °? Falta de aire. °? Micción dolorosa o con sangre. °? Problemas visuales. °· Vómitos durante 12 horas o más. °· Dolor de cabeza intenso. °· Tiene pensamientos serios acerca de lastimarse a usted misma o dañar al niño o a otra persona. ° °Esta información no tiene como fin reemplazar el consejo del médico. Asegúrese de hacerle al médico cualquier pregunta que  tenga. °Document Released: 01/07/2005 Document Revised: 05/01/2015 Document Reviewed: 07/11/2014 °Elsevier Interactive Patient Education © 2017 Elsevier Inc. ° °

## 2016-10-22 NOTE — Lactation Note (Signed)
This note was copied from a baby's chart. Lactation Consultation Note  Patient Name: [name] ZOXWR'U Date: 10/22/2016 Reason for consult: Follow-up assessment Baby at 30 hr of life. Mom called for latch help but upon entry baby was latched in cradle to the L breast. Several swallows were noted. Answered mom's questions about infant gas and referred her to MD. Praised family for bf, encouraged mom to latch baby on demand. Family is aware of lactation services and support group. They will call as needed.   Maternal Data    Feeding Feeding Type: Breast Fed Length of feed:  (still latched at exit)  [facility] Score Latch: Repeated attempts needed to sustain latch, nipple held in mouth throughout feeding, stimulation needed to elicit sucking reflex. (per mom)  Audible Swallowing: A few with stimulation  Type of Nipple: Everted at rest and after stimulation  Comfort (Breast/Nipple): Soft / non-tender  Hold (Positioning): No assistance needed to correctly position infant at breast. (per mom)  LATCH Score: 8  Interventions    Lactation Tools Discussed/Used     Consult Status Consult Status: Complete    Rulon Eisenmenger 10/22/2016, 12:05 PM

## 2016-10-22 NOTE — Lactation Note (Signed)
This note was copied from a baby's chart. Lactation Consultation Note  Patient Name: Boy Pennie Vanblarcom ZOXWR'U Date: 10/22/2016 Reason for consult: Follow-up assessment  Family member is interpreting. Mother states she can easily express drops. Baby 27 hours.  Mother states baby is Bethann Goo and is fussy when she tries to latch. Mom has my # to call for assist w/next feeding.    Maternal Data    Feeding Feeding Type: Breast Fed Length of feed: 20 min  LATCH Score Latch: Grasps breast easily, tongue down, lips flanged, rhythmical sucking.  Audible Swallowing: A few with stimulation  Type of Nipple: Everted at rest and after stimulation  Comfort (Breast/Nipple): Soft / non-tender  Hold (Positioning): No assistance needed to correctly position infant at breast.  LATCH Score: 9  Interventions    Lactation Tools Discussed/Used     Consult Status Consult Status: Complete    Hardie Pulley 10/22/2016, 9:39 AM

## 2016-10-22 NOTE — Discharge Summary (Signed)
Attestation of Attending Supervision of Advanced Practitioner (PA/CNM/NP): Evaluation and management procedures were performed by the Advanced Practitioner under my supervision and collaboration.  I have reviewed the Advanced Practitioner's note and chart, and I agree with the management and plan.  Candelaria Celeste, DO Attending Physician Faculty Practice, [facility] [place]    [place] Discharge Summary     Patient Name: Angela Herring DOB: Nov 01, 1985 MRN: [id number]  Date of admission: 10/20/2016 Delivering MD: Pincus Large   Date of discharge: 10/22/2016  Admitting diagnosis: 40.3 WKS, BLOODY SHOW, BACK PAIN Intrauterine pregnancy: [redacted]w[redacted]d     Secondary diagnosis:  Active Problems:   Post term pregnancy over 40 weeks   Post term pregnancy   SVD (spontaneous vaginal delivery)  Additional problems: Low vitamin D, Anemia affecting pregnancy in third trimester, UTI in third trimester     Discharge diagnosis: Term Pregnancy Delivered                                                                                                Post partum procedures:none  Augmentation: Pitocin  Complications: None  Hospital course:  Onset of Labor With Vaginal Delivery     31 y.o. yo [id number] at [redacted]w[redacted]d was admitted in Latent Labor on 10/20/2016. Patient had an uncomplicated labor course as follows:  Membrane Rupture Time/Date: 3:25 AM ,10/21/2016   Intrapartum Procedures: Episiotomy: None [1]                                         Lacerations:  None [1]  Patient had a delivery of a Viable infant. 10/21/2016  Information for the patient's newborn:  Johnnette, Laux [[id number]]       Pateint had an uncomplicated postpartum course.  She is ambulating, tolerating a regular diet, passing flatus, and urinating well. Patient is discharged home in stable condition on 10/22/16.   Physical exam  Vitals:   10/21/16 1015 10/21/16 1408 10/21/16 1759 10/22/16 0613  BP: 103/62 (!) 109/51 101/61 115/73   Pulse: 78 75 91 82  Resp: Temp: 98.6 F (37 C) 97.9 F (36.6 C) 98.6 F (37 C) 98.5 F (36.9 C)  TempSrc: Oral Oral Oral Oral  Weight:      Height:       General: alert, cooperative and no distress Lochia: appropriate Uterine Fundus: firm Incision: none DVT Evaluation: No evidence of DVT seen on physical exam. No cords or calf tenderness. Labs: Lab Results  Component Value Date   WBC 8.7 10/20/2016   HGB 11.1 (L) 10/20/2016   HCT 33.2 (L) 10/20/2016   MCV 85.8 10/20/2016   PLT 178 10/20/2016   No flowsheet data found.  Discharge instruction: per After Visit Summary and "Baby and Me Booklet".  After visit meds:  Allergies as of 10/22/2016   No Known Allergies     Medication List    STOP taking these medications   cephALEXin 500 MG capsule Commonly known as:  KEFLEX   Doxylamine-Pyridoxine 10-10  MG Tbec Commonly known as:  DICLEGIS     TAKE these medications   acetaminophen 500 MG tablet Commonly known as:  TYLENOL Take 500 mg by mouth every 6 (six) hours as needed for moderate pain or headache.   cyclobenzaprine 10 MG tablet Commonly known as:  FLEXERIL Take 1 tablet (10 mg total) by mouth 2 (two) times daily as needed for muscle spasms.   multivitamin-prenatal 27-0.8 MG Tabs tablet Take 1 tablet by mouth daily at 12 noon.   norethindrone 0.35 MG tablet Commonly known as:  ORTHO MICRONOR Take 1 tablet (0.35 mg total) by mouth daily.   Vitamin D (Ergocalciferol) 50000 units Caps capsule Commonly known as:  DRISDOL Take 1 capsule (50,000 Units total) by mouth every 7 (seven) days. What changed:  additional instructions       Diet: routine diet  Activity: Advance as tolerated. Pelvic rest for 6 weeks.   Outpatient follow up:6 weeks Follow up Appt: Future Appointments Date Time Provider Department Center  11/18/2016 10:00 AM Orvilla Cornwall A, CNM [facility] None    Postpartum contraception: Tubal Ligation  Newborn Data: Live  born female  Birth Weight: 7 lb 14.6 oz (3590 g) APGAR: 9, 9  Baby Feeding: Bottle and Breast Disposition:home with mother   10/22/2016 Marylene Land, CNM  I confirm that I have verified the information documented in the resident's note and that I have also personally reperformed the physical exam and all medical decision making activities.   Luna Kitchens CNM

## 2016-10-22 NOTE — Discharge Summary (Deleted)
Obstetric Discharge Summary Reason for Admission: vaginal bleeding, decreased fetal movments, and post dates Prenatal Procedures: none Intrapartum Procedures: spontaneous vaginal delivery Postpartum Procedures: none Complications-Operative and Postpartum: none Hemoglobin  Date Value Ref Range Status  10/20/2016 11.1 (L) 12.0 - 15.0 g/dL Final  40/98/1191 47.8 (L) 11.1 - 15.9 g/dL Final   HCT  Date Value Ref Range Status  10/20/2016 33.2 (L) 36.0 - 46.0 % Final   Hematocrit  Date Value Ref Range Status  08/07/2016 30.9 (L) 34.0 - 46.6 % Final    Physical Exam:  General: alert, cooperative and no distress Lochia: appropriate Uterine Fundus: firm Incision: none DVT Evaluation: No evidence of DVT seen on physical exam. No cords or calf tenderness.  Discharge Diagnoses: Post-date pregnancy  Discharge Information: Date: 10/22/2016 Activity: pelvic rest Diet: routine Medications: None Condition: stable Instructions: refer to practice specific booklet Discharge to: home   Newborn Data: Live born female  Birth Weight: 7 lb 14.6 oz (3590 g) APGAR: 9, 9  Home with mother.  Arlyce Harman 10/22/2016, 10:44 AM

## 2016-10-22 NOTE — Progress Notes (Addendum)
Post Partum Day 1 Subjective: no complaints, up ad lib, voiding, tolerating PO and + flatus  Objective: Blood pressure 115/73, pulse 82, temperature 98.5 F (36.9 C), temperature source Oral, resp. rate 18, height  (1.651 m), weight 173 lb (78.5 kg), last menstrual period 01/11/2016, unknown if currently breastfeeding.  Physical Exam:  General: alert, cooperative, appears stated age and no distress Lochia: appropriate Uterine Fundus: firm Incision: no incision DVT Evaluation: No evidence of DVT seen on physical exam.   Recent Labs  10/20/16 1725  HGB 11.1*  HCT 33.2*    Assessment/Plan: Discharge home   LOS: 2 days   Angela Herring 10/22/2016, 7:38 AM

## 2016-10-23 ENCOUNTER — Encounter: Payer: Medicaid Other | Admitting: Certified Nurse Midwife

## 2016-10-24 ENCOUNTER — Inpatient Hospital Stay (HOSPITAL_COMMUNITY): Admission: RE | Admit: 2016-10-24 | Payer: Medicaid Other | Source: Ambulatory Visit

## 2016-11-18 ENCOUNTER — Ambulatory Visit (INDEPENDENT_AMBULATORY_CARE_PROVIDER_SITE_OTHER): Payer: Medicaid Other | Admitting: Obstetrics and Gynecology

## 2016-11-18 ENCOUNTER — Ambulatory Visit: Payer: Medicaid Other | Admitting: Certified Nurse Midwife

## 2016-11-18 DIAGNOSIS — Z1389 Encounter for screening for other disorder: Secondary | ICD-10-CM | POA: Diagnosis not present

## 2016-11-18 MED ORDER — DOCUSATE SODIUM 100 MG PO CAPS: 100.0000 mg | ORAL_CAPSULE | Freq: Two times a day (BID) | ORAL | 2 refills | Status: DC

## 2016-11-18 MED ORDER — PHENYLEPH-SHARK LIV OIL-MO-PET 0.25-3-14-71.9 % RE OINT: 1.0000 "application " | TOPICAL_OINTMENT | Freq: Two times a day (BID) | RECTAL | 0 refills | Status: DC | PRN

## 2016-11-18 MED ORDER — NORETHINDRONE 0.35 MG PO TABS: 1.0000 | ORAL_TABLET | Freq: Every day | ORAL | 11 refills | Status: DC

## 2016-11-18 NOTE — Progress Notes (Signed)
Post Partum Exam  Angela Herring is a 31 y.o. [phone] female who presents for a postpartum visit. She is 4 weeks postpartum following a spontaneous vaginal delivery. I have fully reviewed the prenatal and intrapartum course. The delivery was at 40.4 gestational weeks.  Anesthesia: epidural. Postpartum course has been doing well. Baby's course has been doing well. Baby is feeding by breast. Bleeding thin lochia. Bowel function is abnormal: bleeding with bowel movements and suffering from constipation. Bladder function is normal. Patient is not sexually active. Contraception method is abstinence. Would like to have BTL, pt states papers have been signed. Interested in pill until surgery can be scheduled. Postpartum depression screening:neg     Review of Systems Pertinent items are noted in HPI.    Objective:  Blood pressure 102/70, pulse 72, weight 155 lb (70.3 kg), unknown if currently breastfeeding.  General:  alert, cooperative and no distress   Breasts:  inspection negative, no nipple discharge or bleeding, no masses or nodularity palpable  Lungs: clear to auscultation bilaterally  Heart:  regular rate and rhythm  Abdomen: soft, non-tender; bowel sounds normal; no masses,  no organomegaly   Vulva:  normal  Vagina: normal vagina, no discharge, exudate, lesion, or erythema  Cervix:  multiparous appearance  Corpus: normal size, contour, position, consistency, mobility, non-tender  Adnexa:  no mass, fullness, tenderness  Rectal Exam: external hemorrhoids, not bleeding, not tender, approximately 0.5 cm        Assessment:    Normal postpartum exam. Pap smear not done at today's visit.   Plan:   1. Contraception: oral progesterone-only contraceptive. Patient will be scheduled for laparoscopic bilateral tubal ligation. Risks, benefits and alternatives were explained including but not limited to risks of bleeding, infection and damage to adjacent organs. Patient verbalized understanding 2.  Patient is cleared to resume activities of daily living. Rx Colace and preparation H provided to aid with constipation and hemorrhoid 3. Follow up in: March for annual exam or as needed.

## 2016-11-19 ENCOUNTER — Encounter (HOSPITAL_COMMUNITY): Payer: Self-pay

## 2016-11-19 ENCOUNTER — Encounter: Payer: Self-pay | Admitting: Obstetrics and Gynecology

## 2016-12-09 ENCOUNTER — Encounter (HOSPITAL_BASED_OUTPATIENT_CLINIC_OR_DEPARTMENT_OTHER): Payer: Self-pay | Admitting: *Deleted

## 2016-12-09 ENCOUNTER — Other Ambulatory Visit: Payer: Self-pay

## 2016-12-09 NOTE — Pre-Procedure Instructions (Signed)
To come for Type & screen and urine preg.

## 2016-12-17 ENCOUNTER — Encounter (HOSPITAL_BASED_OUTPATIENT_CLINIC_OR_DEPARTMENT_OTHER)
Admission: RE | Admit: 2016-12-17 | Discharge: 2016-12-17 | Disposition: A | Discharge status: Home / Self Care | Payer: Medicaid Other | Source: Ambulatory Visit | Attending: Obstetrics and Gynecology | Admitting: Obstetrics and Gynecology

## 2016-12-17 DIAGNOSIS — Z01812 Encounter for preprocedural laboratory examination: Secondary | ICD-10-CM | POA: Diagnosis not present

## 2016-12-17 DIAGNOSIS — Z302 Encounter for sterilization: Secondary | ICD-10-CM | POA: Diagnosis present

## 2016-12-17 LAB — TYPE AND SCREEN
ABO/RH(D): A POS
Antibody Screen: NEGATIVE

## 2016-12-17 LAB — ABO/RH: ABO/RH(D): A POS

## 2016-12-17 LAB — POCT PREGNANCY, URINE: Preg Test, Ur: NEGATIVE

## 2016-12-17 NOTE — Pre-Procedure Instructions (Signed)
Alis will be interpreter for pt., per Judy at [facility]; please call [phone] if surgery time changes. 

## 2016-12-17 NOTE — H&P (Signed)
Angela Herring is an 31 y.o. female [id number] presenting for scheduled sterilization procedure. Patient reports doing well and is without complaints. She has been using COC for contraception  Pertinent Gynecological History: Menses: regular every month without intermenstrual spotting Bleeding: normal Contraception: OCP (estrogen/progesterone) DES exposure: denies Blood transfusions: none Previous GYN Procedures: DNC  Last mammogram: n/a Last pap: normal Date: 03/2016 OB History: G4, P3013   Menstrual History:  No LMP recorded.    Past Medical History:  Diagnosis Date  . Anemia    taking iron supplement     Past Surgical History:  Procedure Laterality Date  . DILATION AND CURETTAGE OF UTERUS  04/28/2009  . OOPHORECTOMY Left     History reviewed. No pertinent family history.  Social History:  reports that  has never smoked. she has never used smokeless tobacco. She reports that she does not drink alcohol or use drugs.  Allergies: No Known Allergies  Medications Prior to Admission  Medication Sig Dispense Refill Last Dose  . Prenatal Vit-Fe Fumarate-FA (MULTIVITAMIN-PRENATAL) 27-0.8 MG TABS tablet Take 1 tablet by mouth daily at 12 noon.   12/17/2016 at Unknown time    ROS See pertinent in HPI Blood pressure (!) 97/51, pulse 69, temperature 97.6 F (36.4 C), temperature source Oral, resp. rate 18, height 5\' 5"  (1.651 m), weight 150 lb (68 kg), SpO2 100 %, currently breastfeeding. Physical Exam GENERAL: Well-developed, well-nourished female in no acute distress.  HEENT: Normocephalic, atraumatic. Sclerae anicteric.  NECK: Supple. Normal thyroid.  LUNGS: Clear to auscultation bilaterally.  HEART: Regular rate and rhythm. ABDOMEN: Soft, nontender, nondistended. No organomegaly. PELVIC: Deferred to OR EXTREMITIES: No cyanosis, clubbing, or edema, 2+ distal pulses.  Results for orders placed or performed during the hospital encounter of 12/18/16 (from the past 24 hour(s))   Type and screen     Status: None   Collection Time: 12/17/16 10:50 AM  Result Value Ref Range   ABO/RH(D) A POS    Antibody Screen NEG    Sample Expiration 12/20/2016     No results found.  Assessment/Plan:  31 y.o. [id number]  with undesired fertility, here for permanent sterilization.  -Other reversible forms of contraception were discussed with patient; she declines all other modalities.  Risks of procedure discussed with patient including permanence of method, bleeding, infection, injury to surrounding organs and need for additional procedures including laparotomy, risk of regret.  Failure risk of 0.5-1% with increased risk of ectopic gestation if pregnancy occurs was also discussed with patient.   - Patient verbalized understanding and all questions were answered - Spanish interpreter present during the counseling session   Angela Herring 12/18/2016, 8:47 AM

## 2016-12-18 ENCOUNTER — Ambulatory Visit (HOSPITAL_BASED_OUTPATIENT_CLINIC_OR_DEPARTMENT_OTHER)
Admission: RE | Admit: 2016-12-18 | Discharge: 2016-12-18 | Disposition: A | Discharge status: Home / Self Care | Payer: Medicaid Other | Source: Ambulatory Visit | Attending: Obstetrics and Gynecology | Admitting: Obstetrics and Gynecology

## 2016-12-18 ENCOUNTER — Encounter (HOSPITAL_BASED_OUTPATIENT_CLINIC_OR_DEPARTMENT_OTHER)
Admission: RE | Disposition: A | Discharge status: Home / Self Care | Payer: Self-pay | Source: Ambulatory Visit | Attending: Obstetrics and Gynecology

## 2016-12-18 ENCOUNTER — Encounter (HOSPITAL_BASED_OUTPATIENT_CLINIC_OR_DEPARTMENT_OTHER): Payer: Self-pay

## 2016-12-18 ENCOUNTER — Ambulatory Visit (HOSPITAL_BASED_OUTPATIENT_CLINIC_OR_DEPARTMENT_OTHER): Payer: Medicaid Other | Admitting: Certified Registered Nurse Anesthetist

## 2016-12-18 ENCOUNTER — Encounter (HOSPITAL_COMMUNITY): Payer: Self-pay

## 2016-12-18 DIAGNOSIS — Z302 Encounter for sterilization: Secondary | ICD-10-CM

## 2016-12-18 HISTORY — DX: Anemia, unspecified: D64.9

## 2016-12-18 HISTORY — PX: LAPAROSCOPIC TUBAL LIGATION: SHX1937

## 2016-12-18 SURGERY — LIGATION, FALLOPIAN TUBE, LAPAROSCOPIC
Anesthesia: General | Site: Abdomen | Laterality: Bilateral

## 2016-12-18 MED ORDER — BUPIVACAINE HCL (PF) 0.25 % IJ SOLN
INTRAMUSCULAR | Status: DC | PRN
  Administered 2016-12-18: 8.5 mL

## 2016-12-18 MED ORDER — SUGAMMADEX SODIUM 200 MG/2ML IV SOLN
INTRAVENOUS | Status: DC | PRN
  Administered 2016-12-18: 136 mg via INTRAVENOUS

## 2016-12-18 MED ORDER — GLYCOPYRROLATE 0.2 MG/ML IJ SOLN
INTRAMUSCULAR | Status: DC | PRN
  Administered 2016-12-18: 0.1 mg via INTRAVENOUS

## 2016-12-18 MED ORDER — LIDOCAINE HCL (CARDIAC) 20 MG/ML IV SOLN
INTRAVENOUS | Status: DC | PRN
Start: 2016-12-18 — End: 2016-12-18
  Administered 2016-12-18: 100 mg via INTRAVENOUS

## 2016-12-18 MED ORDER — FENTANYL CITRATE (PF) 100 MCG/2ML IJ SOLN
INTRAMUSCULAR | Status: DC | PRN
  Administered 2016-12-18: 100 ug via INTRAVENOUS
  Administered 2016-12-18: 50 ug via INTRAVENOUS

## 2016-12-18 MED ORDER — MIDAZOLAM HCL 2 MG/2ML IJ SOLN: 1.0000 mg | INTRAMUSCULAR | Status: DC | PRN

## 2016-12-18 MED ORDER — FENTANYL CITRATE (PF) 100 MCG/2ML IJ SOLN
INTRAMUSCULAR | Status: AC
  Filled 2016-12-18: qty 2

## 2016-12-18 MED ORDER — EPHEDRINE 5 MG/ML INJ
INTRAVENOUS | Status: AC
  Filled 2016-12-18: qty 10

## 2016-12-18 MED ORDER — SCOPOLAMINE 1 MG/3DAYS TD PT72: 1.0000 | MEDICATED_PATCH | Freq: Once | TRANSDERMAL | Status: DC | PRN

## 2016-12-18 MED ORDER — ONDANSETRON HCL 4 MG/2ML IJ SOLN: 4.0000 mg | Freq: Once | INTRAMUSCULAR | Status: DC | PRN

## 2016-12-18 MED ORDER — ROCURONIUM BROMIDE 10 MG/ML (PF) SYRINGE
PREFILLED_SYRINGE | INTRAVENOUS | Status: AC
Start: 2016-12-18 — End: ?
  Filled 2016-12-18: qty 5

## 2016-12-18 MED ORDER — MIDAZOLAM HCL 2 MG/2ML IJ SOLN
INTRAMUSCULAR | Status: AC
  Filled 2016-12-18: qty 2

## 2016-12-18 MED ORDER — DEXAMETHASONE SODIUM PHOSPHATE 4 MG/ML IJ SOLN
INTRAMUSCULAR | Status: DC | PRN
  Administered 2016-12-18: 10 mg via INTRAVENOUS

## 2016-12-18 MED ORDER — SUCCINYLCHOLINE CHLORIDE 200 MG/10ML IV SOSY
PREFILLED_SYRINGE | INTRAVENOUS | Status: AC
  Filled 2016-12-18: qty 10

## 2016-12-18 MED ORDER — EPHEDRINE SULFATE 50 MG/ML IJ SOLN
INTRAMUSCULAR | Status: DC | PRN
  Administered 2016-12-18: 5 mg via INTRAVENOUS

## 2016-12-18 MED ORDER — ONDANSETRON HCL 4 MG/2ML IJ SOLN
INTRAMUSCULAR | Status: AC
Start: 2016-12-18 — End: ?
  Filled 2016-12-18: qty 2

## 2016-12-18 MED ORDER — PROPOFOL 10 MG/ML IV BOLUS
INTRAVENOUS | Status: DC | PRN
Start: 2016-12-18 — End: 2016-12-18
  Administered 2016-12-18: 150 mg via INTRAVENOUS

## 2016-12-18 MED ORDER — ROCURONIUM BROMIDE 100 MG/10ML IV SOLN
INTRAVENOUS | Status: DC | PRN
  Administered 2016-12-18: 20 mg via INTRAVENOUS

## 2016-12-18 MED ORDER — PROPOFOL 10 MG/ML IV BOLUS
INTRAVENOUS | Status: AC
  Filled 2016-12-18: qty 20

## 2016-12-18 MED ORDER — LACTATED RINGERS IV SOLN
INTRAVENOUS | Status: DC
  Administered 2016-12-18 (×2): via INTRAVENOUS

## 2016-12-18 MED ORDER — DEXAMETHASONE SODIUM PHOSPHATE 10 MG/ML IJ SOLN
INTRAMUSCULAR | Status: AC
  Filled 2016-12-18: qty 1

## 2016-12-18 MED ORDER — KETOROLAC TROMETHAMINE 30 MG/ML IJ SOLN
INTRAMUSCULAR | Status: DC | PRN
  Administered 2016-12-18: 30 mg via INTRAVENOUS

## 2016-12-18 MED ORDER — SUGAMMADEX SODIUM 200 MG/2ML IV SOLN
INTRAVENOUS | Status: AC
  Filled 2016-12-18: qty 2

## 2016-12-18 MED ORDER — SUCCINYLCHOLINE CHLORIDE 20 MG/ML IJ SOLN
INTRAMUSCULAR | Status: DC | PRN
  Administered 2016-12-18: 100 mg via INTRAVENOUS

## 2016-12-18 MED ORDER — IBUPROFEN 600 MG PO TABS: 600.0000 mg | ORAL_TABLET | Freq: Four times a day (QID) | ORAL | 6 refills | Status: DC | PRN

## 2016-12-18 MED ORDER — FENTANYL CITRATE (PF) 100 MCG/2ML IJ SOLN
25.0000 ug | INTRAMUSCULAR | Status: DC | PRN
  Administered 2016-12-18 (×2): 25 ug via INTRAVENOUS

## 2016-12-18 MED ORDER — ONDANSETRON HCL 4 MG/2ML IJ SOLN
INTRAMUSCULAR | Status: DC | PRN
  Administered 2016-12-18: 4 mg via INTRAVENOUS

## 2016-12-18 MED ORDER — LIDOCAINE 2% (20 MG/ML) 5 ML SYRINGE
INTRAMUSCULAR | Status: AC
  Filled 2016-12-18: qty 5

## 2016-12-18 MED ORDER — OXYCODONE-ACETAMINOPHEN 5-325 MG PO TABS: 1.0000 | ORAL_TABLET | Freq: Four times a day (QID) | ORAL | 0 refills | Status: DC | PRN

## 2016-12-18 MED ORDER — FENTANYL CITRATE (PF) 100 MCG/2ML IJ SOLN: 50.0000 ug | INTRAMUSCULAR | Status: DC | PRN

## 2016-12-18 MED ORDER — KETOROLAC TROMETHAMINE 30 MG/ML IJ SOLN
INTRAMUSCULAR | Status: AC
  Filled 2016-12-18: qty 1

## 2016-12-18 SURGICAL SUPPLY — 19 items
CATH ROBINSON RED A/P 16FR (CATHETERS) IMPLANT
DRSG OPSITE POSTOP 3X4 (GAUZE/BANDAGES/DRESSINGS) ×3 IMPLANT
DURAPREP 26ML APPLICATOR (WOUND CARE) ×3 IMPLANT
ELECT REM PT RETURN 9FT ADLT (ELECTROSURGICAL)
ELECTRODE REM PT RTRN 9FT ADLT (ELECTROSURGICAL) IMPLANT
GLOVE BIOGEL PI IND STRL 6.5 (GLOVE) ×2 IMPLANT
GLOVE BIOGEL PI IND STRL 7.0 (GLOVE) ×2 IMPLANT
GLOVE BIOGEL PI INDICATOR 6.5 (GLOVE) ×4
GLOVE BIOGEL PI INDICATOR 7.0 (GLOVE) ×4
GLOVE SURG SS PI 6.0 STRL IVOR (GLOVE) ×3 IMPLANT
GOWN STRL REUS W/TWL LRG LVL3 (GOWN DISPOSABLE) ×6 IMPLANT
NS IRRIG 1000ML POUR BTL (IV SOLUTION) ×3 IMPLANT
PACK LAPAROSCOPY BASIN (CUSTOM PROCEDURE TRAY) ×3 IMPLANT
PAD ARMBOARD 7.5X6 YLW CONV (MISCELLANEOUS) ×3 IMPLANT
SUT MON AB 4-0 PS1 27 (SUTURE) ×3 IMPLANT
SUT VICRYL 0 UR6 27IN ABS (SUTURE) ×3 IMPLANT
TOWEL OR 17X24 6PK STRL BLUE (TOWEL DISPOSABLE) ×6 IMPLANT
TROCAR BALLN 12MMX100 BLUNT (TROCAR) ×3 IMPLANT
WARMER LAPAROSCOPE (MISCELLANEOUS) ×3 IMPLANT

## 2016-12-18 NOTE — Transfer of Care (Signed)
Immediate Anesthesia Transfer of Care Note  Patient: Angela Herring  Procedure(s) Performed: LAPAROSCOPIC TUBAL LIGATION (Bilateral Abdomen)  Patient Location: PACU  Anesthesia Type:General  Level of Consciousness: awake, alert  and oriented  Airway & Oxygen Therapy: Patient Spontanous Breathing and Patient connected to face mask oxygen  Post-op Assessment: Report given to RN, Post -op Vital signs reviewed and stable and Patient moving all extremities  Post vital signs: Reviewed and stable  Last Vitals:  Vitals:   12/18/16 0836  BP: (!) 97/51  Pulse: 69  Resp: 18  Temp: 36.4 C  SpO2: 100%    Last Pain:  Vitals:   12/18/16 0836  TempSrc: Oral         Complications: No apparent anesthesia complications

## 2016-12-18 NOTE — Anesthesia Preprocedure Evaluation (Addendum)
Anesthesia Evaluation  Patient identified by MRN, date of birth, ID band Patient awake    Reviewed: Allergy & Precautions, NPO status , Patient's Chart, lab work & pertinent test results  Airway Mallampati: II  TM Distance: >3 FB Neck ROM: Full    Dental  (+) Teeth Intact, Dental Advisory Given   Pulmonary neg pulmonary ROS,    Pulmonary exam normal breath sounds clear to auscultation       Cardiovascular Exercise Tolerance: Good negative cardio ROS Normal cardiovascular exam Rhythm:Regular Rate:Normal     Neuro/Psych negative neurological ROS  negative psych ROS   GI/Hepatic negative GI ROS, Neg liver ROS,   Endo/Other  negative endocrine ROS  Renal/GU negative Renal ROS     Musculoskeletal negative musculoskeletal ROS (+)   Abdominal   Peds  Hematology  (+) Blood dyscrasia, anemia ,   Anesthesia Other Findings Day of surgery medications reviewed with the patient.  Reproductive/Obstetrics negative OB ROS                             Anesthesia Physical Anesthesia Plan  ASA: II  Anesthesia Plan: General   Post-op Pain Management:    Induction: Intravenous  PONV Risk Score and Plan: 4 or greater and Ondansetron, Dexamethasone and Treatment may vary due to age or medical condition  Airway Management Planned: Oral ETT  Additional Equipment:   Intra-op Plan:   Post-operative Plan: Extubation in OR  Informed Consent: I have reviewed the patients History and Physical, chart, labs and discussed the procedure including the risks, benefits and alternatives for the proposed anesthesia with the patient or authorized representative who has indicated his/her understanding and acceptance.   Dental advisory given  Plan Discussed with: CRNA  Anesthesia Plan Comments: (Risks/benefits of general anesthesia discussed with patient including risk of damage to teeth, lips, gum, and tongue,  nausea/vomiting, allergic reactions to medications, and the possibility of heart attack, stroke and death.  All patient questions answered.  Patient wishes to proceed.  Spanish interpreter utilized.  Breast feeding patient, will only use rapid acting medications, no midazolam, no scopolamine patch, no phenergan.)      Anesthesia Quick Evaluation

## 2016-12-18 NOTE — Discharge Instructions (Signed)
Ligadura laparoscpica de trompas, cuidados posteriores (Laparoscopic Tubal Ligation, Care After) Siga estas instrucciones durante las prximas semanas. Estas indicaciones le proporcionan informacin acerca de cmo deber cuidarse despus del procedimiento. El mdico tambin podr darle instrucciones ms especficas. El tratamiento ha sido planificado segn las prcticas mdicas actuales, pero en algunos casos pueden ocurrir problemas. Comunquese con el mdico si tiene algn problema o dudas despus del procedimiento. QU ESPERAR DESPUS DEL PROCEDIMIENTO Despus del procedimiento, es comn DIRECTVtener los siguientes sntomas:  Dolor de Advertising copywritergarganta.  Molestias en el hombro.  Calambres o molestias leves en el abdomen.  Dolor por gases.  Dolor o molestias en la zona donde se realiz el corte quirrgico (incisin).  Sensacin de hinchazn.  Cansancio.  Nuseas.  Vmitos. INSTRUCCIONES PARA EL CUIDADO EN EL HOGAR Medicamentos  Baxter Internationalome los medicamentos de venta libre y los recetados solamente como se lo haya indicado el mdico.  No tome aspirina, ya que puede causar hemorragias.  No conduzca ni opere maquinaria pesada mientras toma analgsicos recetados. Actividades  Haga reposo durante el resto del da.  Reanude sus actividades normales como se lo haya indicado el mdico. Pregntele al mdico qu actividades son seguras para usted. Cuidado de la incisin  Siga las indicaciones del mdico acerca del cuidado de la incisin. Haga lo siguiente: ? Lvese las manos con agua y jabn antes de Multimedia programmercambiar las vendas (vendaje). Use desinfectante para manos si no dispone de [place] y [place]. ? Cambie el vendaje como se lo haya indicado el mdico. ? No retire los puntos (suturas). Tal vez deban dejarse puestos en la piel durante 2semanas o ms tiempo.  Controle todos los das la zona de la incisin para detectar signos de infeccin. Est atento a los siguientes signos: ? Aumento del enrojecimiento, la  hinchazn o Chief Technology Officerel dolor. ? Ms lquido [place] sangre. ? Calor. ? Pus o mal olor. Otras indicaciones  No tome baos de inmersin, no nade ni use el jacuzzi hasta que el mdico lo autorice. Puede ducharse.  Concurra a todas las visitas de control como se lo haya indicado el mdico. Esto es importante.  Pida a alguien Engineer, manufacturingque le ayude con las tareas PPL Corporationdomsticas diarias durante los Entergy Corporationprimeros das. SOLICITE ATENCIN MDICA SI:  Aumentan el enrojecimiento, la hinchazn o el dolor alrededor de la incisin.  La incisin est caliente al tacto.  Tiene pus o percibe que sale mal olor del lugar de la incisin.  Se le abren los bordes de la incisin despus de que le hayan sacado las suturas.  El dolor no mejora despus de 2a 3das.  Tiene una erupcin cutnea.  Tiene mareos o sensacin de desvanecimiento frecuentes.  El medicamento no IT trainerle calma el dolor.  Tiene estreimiento. SOLICITE ATENCIN MDICA DE INMEDIATO SI:  Tiene fiebre.  Se desmaya.  Siente ms dolor en el abdomen.  Tiene un dolor intenso en uno o ambos hombros.  Observa ms lquido o sangre que sale de las suturas o de la vagina.  Le falta el aire o tiene dificultad para respirar.  Siente dolor en el pecho o en las piernas.  Tiene nuseas, vmitos o diarrea de forma continua. Esta informacin no tiene Theme park managercomo fin reemplazar el consejo del mdico. Asegrese de hacerle al mdico cualquier pregunta que tenga. Document Released: 06/26/2007 Document Revised: 05/01/2015 Document Reviewed: 12/18/2014 Elsevier Interactive Patient Education  2018 [employer]  Laparoscopic Tubal Ligation, Care After Refer to this sheet in the next few weeks. These instructions provide you with information about caring for yourself  after your procedure. Your health care provider may also give you more specific instructions. Your treatment has been planned according to current medical practices, but problems sometimes occur. Call your health care  provider if you have any problems or questions after your procedure. What can I expect after the procedure? After the procedure, it is common to have:  A sore throat.  Discomfort in your shoulder.  Mild discomfort or cramping in your abdomen.  Gas pains.  Pain or soreness in the area where the surgical cut (incision) was made.  A bloated feeling.  Tiredness.  Nausea.  Vomiting.  Follow these instructions at home: Medicines  Take over-the-counter and prescription medicines only as told by your health care provider.  Do not take aspirin because it can cause bleeding.  Do not drive or operate heavy machinery while taking prescription pain medicine. Activity  Rest for the rest of the day.  Return to your normal activities as told by your health care provider. Ask your health care provider what activities are safe for you. Incision care   Follow instructions from your health care provider about how to take care of your incision. Make sure you: ? Wash your hands with soap and water before you change your bandage (dressing). If soap and water are not available, use hand sanitizer. ? Change your dressing as told by your health care provider. ? Leave stitches (sutures) in place. They may need to stay in place for 2 weeks or longer.  Check your incision area every day for signs of infection. Check for: ? More redness, swelling, or pain. ? More fluid or blood. ? Warmth. ? Pus or a bad smell. Other Instructions  Do not take baths, swim, or use a hot tub until your health care provider approves. You may take showers.  Keep all follow-up visits as told by your health care provider. This is important.  Have someone help you with your daily household tasks for the first few days. Contact a health care provider if:  You have more redness, swelling, or pain around your incision.  Your incision feels warm to the touch.  You have pus or a bad smell coming from your  incision.  The edges of your incision break open after the sutures have been removed.  Your pain does not improve after 2-3 days.  You have a rash.  You repeatedly become dizzy or light-headed.  Your pain medicine is not helping.  You are constipated. Get help right away if:  You have a fever.  You faint.  You have increasing pain in your abdomen.  You have severe pain in one or both of your shoulders.  You have fluid or blood coming from your sutures or from your vagina.  You have shortness of breath or difficulty breathing.  You have chest pain or leg pain.  You have ongoing nausea, vomiting, or diarrhea. This information is not intended to replace advice given to you by your health care provider. Make sure you discuss any questions you have with your health care provider. Document Released: 07/27/2004 Document Revised: 06/12/2015 Document Reviewed: 12/18/2014 Elsevier Interactive Patient Education  2018 [employer]        Post Anesthesia Home Care Instructions  Activity: Get plenty of rest for the remainder of the day. A responsible individual must stay with you for 24 hours following the procedure.  For the next 24 hours, DO NOT: -Drive a car -Advertising copywriterperate machinery -Drink alcoholic beverages -Take any medication unless instructed  by your physician -Make any legal decisions or sign important papers.  Meals: Start with liquid foods such as gelatin or soup. Progress to regular foods as tolerated. Avoid greasy, spicy, heavy foods. If nausea and/or vomiting occur, drink only clear liquids until the nausea and/or vomiting subsides. Call your physician if vomiting continues.  Special Instructions/Symptoms: Your throat may feel dry or sore from the anesthesia or the breathing tube placed in your throat during surgery. If this causes discomfort, gargle with warm salt water. The discomfort should disappear within 24 hours.  If you had a scopolamine patch placed  behind your ear for the management of post- operative nausea and/or vomiting:  1. The medication in the patch is effective for 72 hours, after which it should be removed.  Wrap patch in a tissue and discard in the trash. Wash hands thoroughly with soap and water. 2. You may remove the patch earlier than 72 hours if you experience unpleasant side effects which may include dry mouth, dizziness or visual disturbances. 3. Avoid touching the patch. Wash your hands with soap and water after contact with the patch.

## 2016-12-18 NOTE — Op Note (Signed)
Angela Herring 12/18/2016  PREOPERATIVE DIAGNOSIS:  Undesired fertility  POSTOPERATIVE DIAGNOSIS:  Undesired fertility  PROCEDURE:  Laparoscopic right Tubal Sterilization using bipolar forceps  SURGEON: Dr. Catalina AntiguaPeggy Naturi Alarid  ANESTHESIA:  General endotracheal  COMPLICATIONS:  None immediate.  ESTIMATED BLOOD LOSS:  Less than 20 ml.  FLUIDS: 900 ml LR.  URINE OUTPUT:  Patient voided prior to entering the OR  INDICATIONS: 31 y.o. [id number]  with undesired fertility, desires permanent sterilization. Other reversible forms of contraception were discussed with patient; she declines all other modalities.  Risks of procedure discussed with patient including permanence of method, bleeding, infection, injury to surrounding organs and need for additional procedures including laparotomy, risk of regret.  Failure risk of 0.5-1% with increased risk of ectopic gestation if pregnancy occurs was also discussed with patient.      FINDINGS:  Normal uterus, normal right tube and ovary. Left tube and ovary previously surgically removed  TECHNIQUE:  The patient was taken to the operating room where general anesthesia was obtained without difficulty.  She was then placed in the dorsal lithotomy position and prepared and draped in sterile fashion.  After an adequate timeout was performed, a bivalved speculum was then placed in the patient's vagina, and the anterior lip of cervix grasped with the single-tooth tenaculum.  The uterine manipulator was then advanced into the uterus.  The speculum was removed from the vagina.  Attention was then turned to the patient's abdomen where a 10-mm skin incision was made on the umbilical fold.  The underlying fascia was identified, grasped with Kocher clamps, tented up and entered sharply with mayo scissors. The fascia was tagged with 0-Vicryl. The peritoneum was entered bluntly.The 11 mm trocar and sleeve were introduced into the abdominal cavityl.  Intraperitoneal placement was  confirmed with the use of the laparoscope. Pneumoperitoneum was achieved by the insufflation of CO2 gas. A survey of the patient's pelvis and abdomen revealed entirely normal anatomy.  The fallopian tubes were observed and found to be normal in appearance. Bipolar forceps was then advanced through the operative port and used to coagulate a 3-cm portion of the right tube in the mid isthmic area.  Good blanching and coagulation was noted at the site of the application.  There was no bleeding noted in the mesosalpinx.  Good hemostasis was noted overall. The instruments were then removed from the patient's abdomen and the fascial incision was repaired with 0 Vicryl, and the skin was closed with 4-0 Vicryl. The uterine manipulator and the tenaculum were removed from the vagina without complications. The patient tolerated the procedure well.  Sponge, lap, and needle counts were correct times two.  The patient was then taken to the recovery room awake, extubated and in stable  in stable condition.

## 2016-12-18 NOTE — Anesthesia Procedure Notes (Signed)
Procedure Name: Intubation Date/Time: 12/18/2016 9:07 AM Performed by: Hewitt Blade, CRNA Pre-anesthesia Checklist: Patient identified, Emergency Drugs available, Suction available and Patient being monitored Patient Re-evaluated:Patient Re-evaluated prior to induction Oxygen Delivery Method: Circle system utilized Preoxygenation: Pre-oxygenation with 100% oxygen Induction Type: IV induction Ventilation: Mask ventilation without difficulty Laryngoscope Size: Mac and 3 Grade View: Grade I Tube type: Oral Tube size: 7.0 mm Number of attempts: 1 Airway Equipment and Method: Stylet Placement Confirmation: ETT inserted through vocal cords under direct vision,  positive ETCO2 and breath sounds checked- equal and bilateral Secured at: 20 cm Tube secured with: Tape Dental Injury: Teeth and Oropharynx as per pre-operative assessment

## 2016-12-18 NOTE — Anesthesia Postprocedure Evaluation (Signed)
Anesthesia Post Note  Patient: Angela Herring  Procedure(s) Performed: LAPAROSCOPIC TUBAL LIGATION (Bilateral Abdomen)     Patient location during evaluation: PACU Anesthesia Type: General Level of consciousness: awake and alert, oriented and awake Pain management: pain level controlled Vital Signs Assessment: post-procedure vital signs reviewed and stable Respiratory status: spontaneous breathing, nonlabored ventilation, respiratory function stable and patient connected to nasal cannula oxygen Cardiovascular status: blood pressure returned to baseline and stable Postop Assessment: no apparent nausea or vomiting Anesthetic complications: no    Last Vitals:  Vitals:   12/18/16 1100 12/18/16 1132  BP: (!) 105/95 111/71  Pulse: 66 70  Resp: 18 16  Temp:  (!) 36.4 C  SpO2: 99% 99%    Last Pain:  Vitals:   12/18/16 1132  TempSrc: Oral  PainSc: 2                  Cecile HearingStephen Edward Quran Vasco

## 2016-12-19 ENCOUNTER — Encounter (HOSPITAL_BASED_OUTPATIENT_CLINIC_OR_DEPARTMENT_OTHER): Payer: Self-pay | Admitting: Obstetrics and Gynecology

## 2016-12-30 ENCOUNTER — Encounter: Payer: Medicaid Other | Admitting: Obstetrics and Gynecology

## 2017-01-07 ENCOUNTER — Encounter: Payer: Self-pay | Admitting: Obstetrics & Gynecology

## 2017-01-07 ENCOUNTER — Ambulatory Visit (INDEPENDENT_AMBULATORY_CARE_PROVIDER_SITE_OTHER): Payer: Self-pay | Admitting: Obstetrics & Gynecology

## 2017-01-07 VITALS — BP 111/68 | HR 72 | Wt 151.0 lb

## 2017-01-07 DIAGNOSIS — Z9889 Other specified postprocedural states: Secondary | ICD-10-CM

## 2017-01-07 NOTE — Progress Notes (Signed)
Subjective:     Angela Herring is a 31 y.o. female who presents to the clinic 3 weeks status post LBTL by Dr. Jolayne Pantheronstant for requested sterilization. Eating a regular diet without difficulty. Bowel movements are normal. The patient is not having any pain.  The following portions of the patient's history were reviewed and updated as appropriate: allergies, current medications, past family history, past medical history, past social history, past surgical history and problem list.  Review of Systems Pertinent items are noted in HPI.    Objective:    BP 111/68   Pulse 72   Wt 151 lb (68.5 kg)   Breastfeeding? Yes   BMI 25.13 kg/m  General:  alert, cooperative and no distress  Abdomen: soft, bowel sounds active, non-tender  Incision:   healing well, no drainage, no erythema, no hernia, no seroma, no swelling, no dehiscence, incision well approximated     Assessment:    Doing well postoperatively. Operative findings again reviewed. Pathology report discussed.    Plan:    1. Continue any current medications. 2. Wound care discussed. 3. Activity restrictions: none 4. Anticipated return to work: not applicable. 5. Follow up: prn  Adam PhenixArnold, [name], MD 01/07/2017

## 2017-01-07 NOTE — Patient Instructions (Signed)
Ligadura laparoscpica de trompas, cuidados posteriores (Laparoscopic Tubal Ligation, Care After) Siga estas instrucciones durante las prximas semanas. Estas indicaciones le proporcionan informacin acerca de cmo deber cuidarse despus del procedimiento. El mdico tambin podr darle instrucciones ms especficas. El tratamiento ha sido planificado segn las prcticas mdicas actuales, pero en algunos casos pueden ocurrir problemas. Comunquese con el mdico si tiene algn problema o dudas despus del procedimiento. QU ESPERAR DESPUS DEL PROCEDIMIENTO Despus del procedimiento, es comn tener los siguientes sntomas:  Dolor de garganta.  Molestias en el hombro.  Calambres o molestias leves en el abdomen.  Dolor por gases.  Dolor o molestias en la zona donde se realiz el corte quirrgico (incisin).  Sensacin de hinchazn.  Cansancio.  Nuseas.  Vmitos. INSTRUCCIONES PARA EL CUIDADO EN EL HOGAR Medicamentos  Tome los medicamentos de venta libre y los recetados solamente como se lo haya indicado el mdico.  No tome aspirina, ya que puede causar hemorragias.  No conduzca ni opere maquinaria pesada mientras toma analgsicos recetados. Actividades  Haga reposo durante el resto del da.  Reanude sus actividades normales como se lo haya indicado el mdico. Pregntele al mdico qu actividades son seguras para usted. Cuidado de la incisin  Siga las indicaciones del mdico acerca del cuidado de la incisin. Haga lo siguiente: ? Lvese las manos con agua y jabn antes de cambiar las vendas (vendaje). Use desinfectante para manos si no dispone de agua y jabn. ? Cambie el vendaje como se lo haya indicado el mdico. ? No retire los puntos (suturas). Tal vez deban dejarse puestos en la piel durante 2semanas o ms tiempo.  Controle todos los das la zona de la incisin para detectar signos de infeccin. Est atento a los siguientes signos: ? Aumento del enrojecimiento, la  hinchazn o el dolor. ? Ms lquido o sangre. ? Calor. ? Pus o mal olor. Otras indicaciones  No tome baos de inmersin, no nade ni use el jacuzzi hasta que el mdico lo autorice. Puede ducharse.  Concurra a todas las visitas de control como se lo haya indicado el mdico. Esto es importante.  Pida a alguien que le ayude con las tareas domsticas diarias durante los primeros das. SOLICITE ATENCIN MDICA SI:  Aumentan el enrojecimiento, la hinchazn o el dolor alrededor de la incisin.  La incisin est caliente al tacto.  Tiene pus o percibe que sale mal olor del lugar de la incisin.  Se le abren los bordes de la incisin despus de que le hayan sacado las suturas.  El dolor no mejora despus de 2a 3das.  Tiene una erupcin cutnea.  Tiene mareos o sensacin de desvanecimiento frecuentes.  El medicamento no le calma el dolor.  Tiene estreimiento. SOLICITE ATENCIN MDICA DE INMEDIATO SI:  Tiene fiebre.  Se desmaya.  Siente ms dolor en el abdomen.  Tiene un dolor intenso en uno o ambos hombros.  Observa ms lquido o sangre que sale de las suturas o de la vagina.  Le falta el aire o tiene dificultad para respirar.  Siente dolor en el pecho o en las piernas.  Tiene nuseas, vmitos o diarrea de forma continua. Esta informacin no tiene como fin reemplazar el consejo del mdico. Asegrese de hacerle al mdico cualquier pregunta que tenga. Document Released: 06/26/2007 Document Revised: 05/01/2015 Document Reviewed: 12/18/2014 Elsevier Interactive Patient Education  2018 Elsevier Inc.  

## 2017-12-23 IMAGING — US US MFM OB COMP +14 WKS
1 series · 14 of 28 positions shown · non-contrast
Comparison: none

[Series 1: us mfm ob comp +14 wks · 14 of 120 slices shown]
[im 5/120]
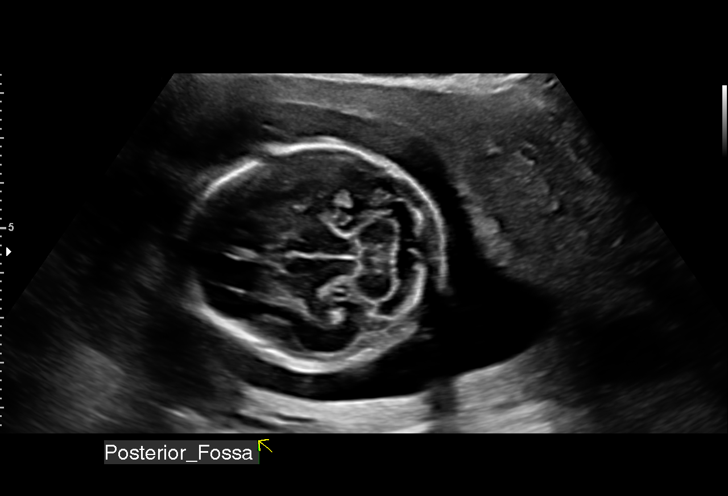
[im 14/120]
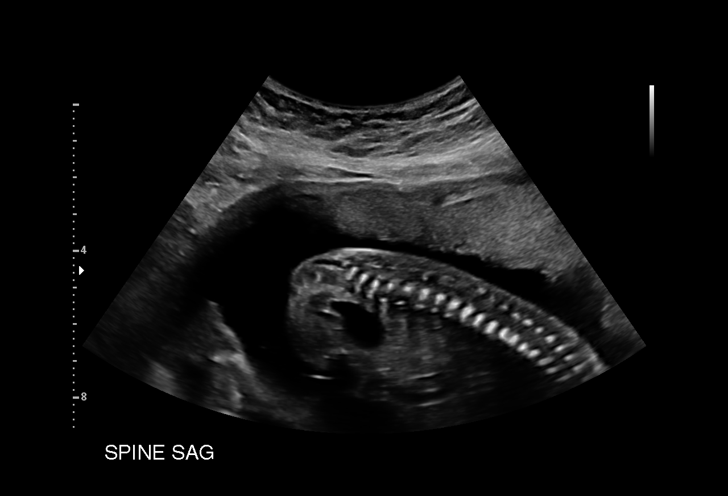
[im 23/120]
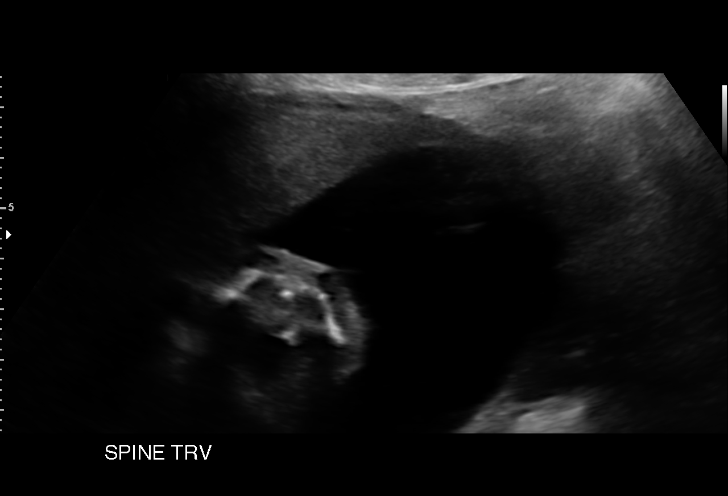
[im 31/120]
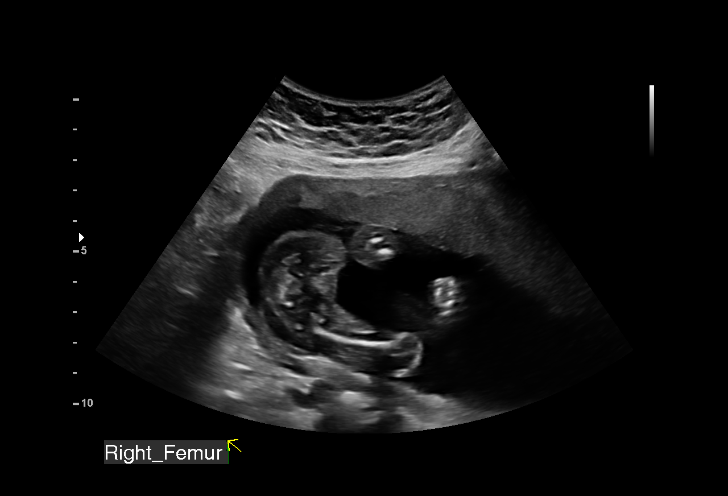
[im 40/120]
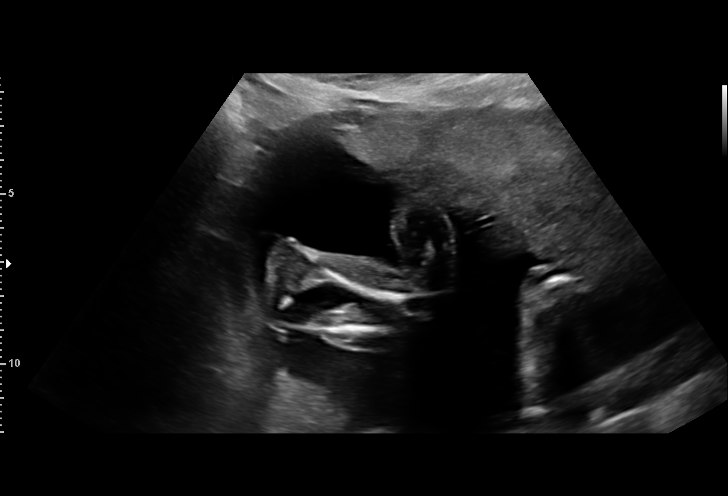
[im 49/120]
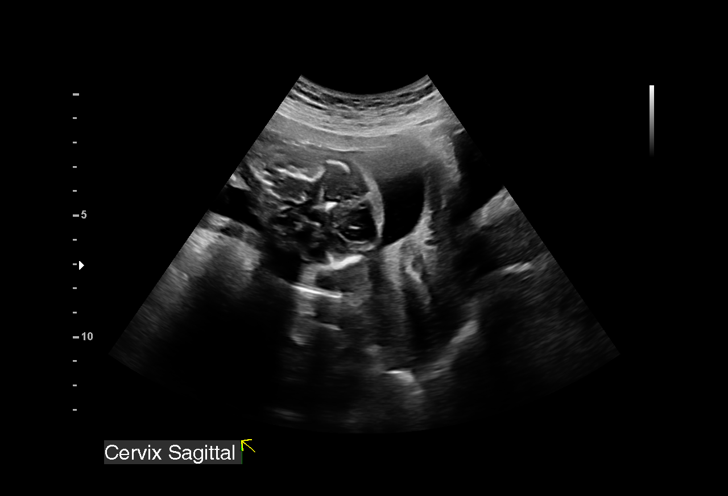
[im 58/120]
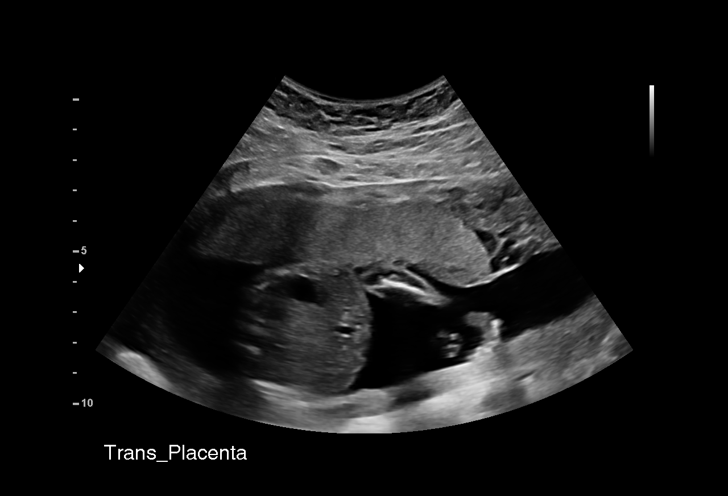
[im 67/120]
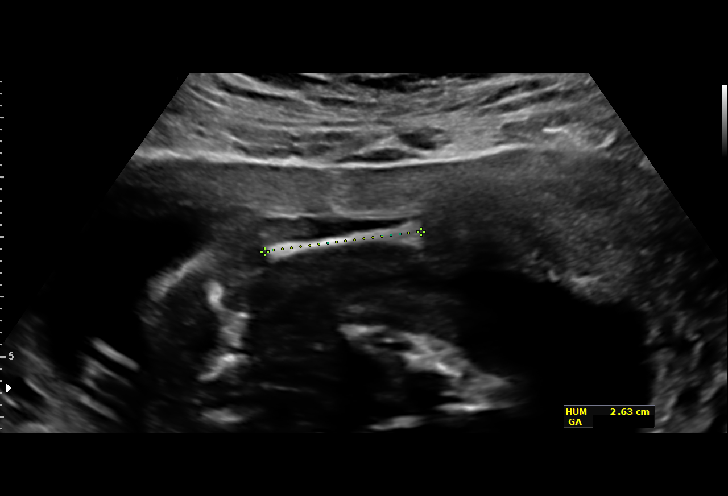
[im 75/120]
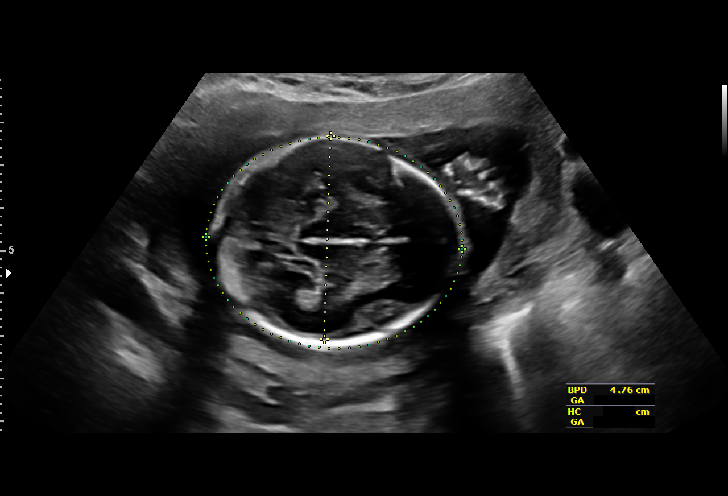
[im 84/120]
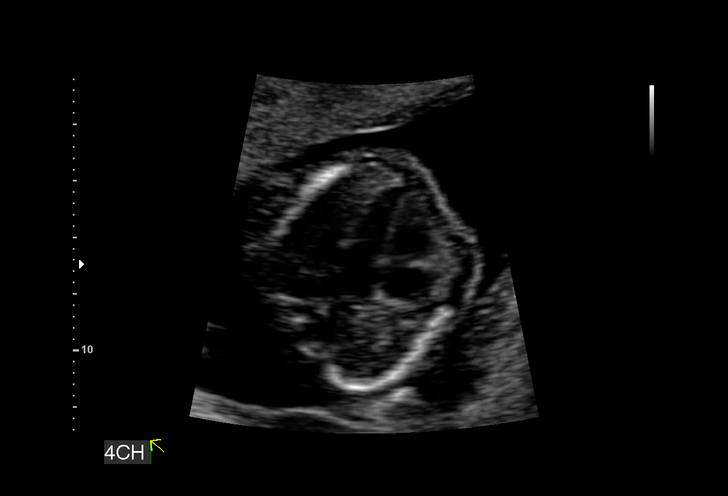
[im 93/120]
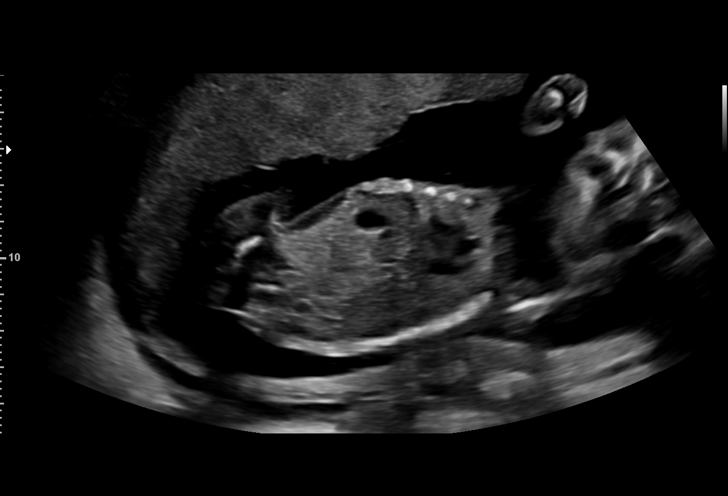
[im 102/120]
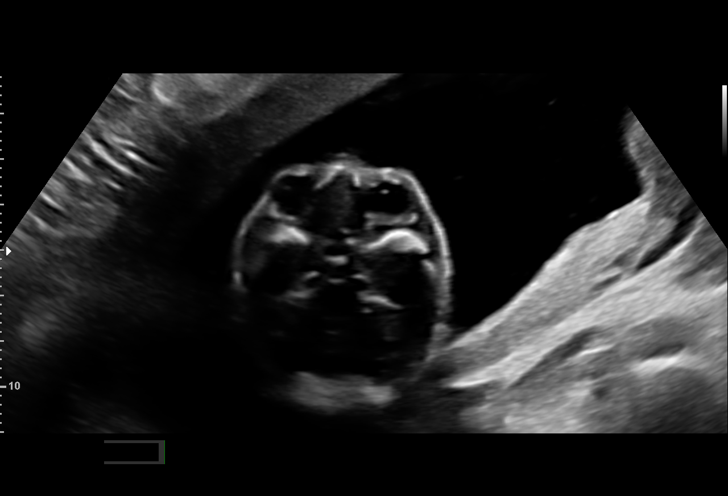
[im 111/120]
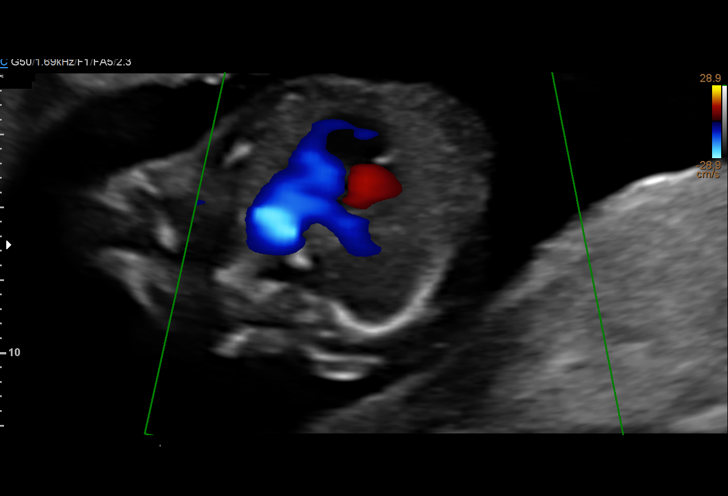
[im 120/120]
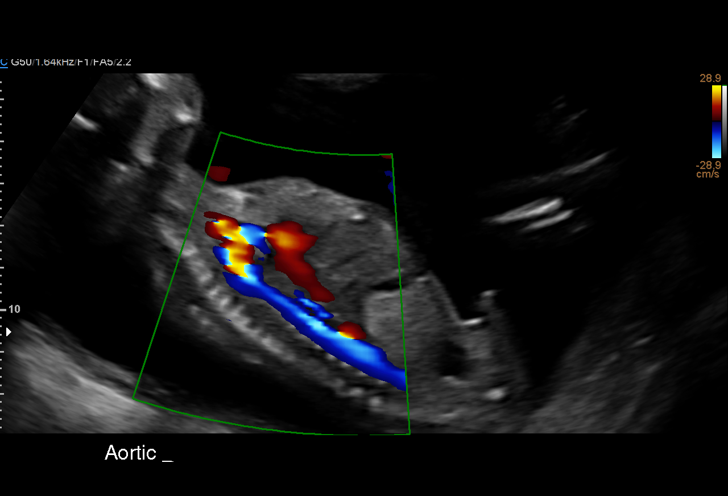

[14 of 28 positions shown; findings below may reference images not displayed]

Road [HOSPITAL]

Indications

19 weeks gestation of pregnancy
Encounter for fetal anatomic survey
OB History

Blood Type:            Height:  5'3"   Weight (lb):  144      BMI:
Gravidity:    4         Term:   2         SAB:   1
Living:       2
Fetal Evaluation

Num Of Fetuses:     1
Fetal Heart         149
Rate(bpm):
Cardiac Activity:   Observed
Presentation:       Cephalic
Placenta:           Anterior, above cervical os
P. Cord Insertion:  Visualized

Amniotic Fluid
AFI FV:      Subjectively within normal limits

Largest Pocket(cm)
4.65
Biometry

BPD:      47.6  mm     G. Age:  20w 3d         92  %    CI:        76.59   %   70 - 86
FL/HC:      16.1   %   16.1 -
HC:      172.3  mm     G. Age:  19w 6d         73  %    HC/AC:      1.19       1.09 -
AC:      144.2  mm     G. Age:  19w 5d         65  %    FL/BPD:     58.4   %
FL:       27.8  mm     G. Age:  18w 4d         22  %    FL/AC:      19.3   %   20 - 24
HUM:      26.4  mm     G. Age:  18w 2d         29  %
CER:      19.2  mm     G. Age:  18w 4d         35  %
NFT:       4.2  mm
CM:        4.7  mm

Est. FW:     283  gm    0 lb 10 oz      48  %
Gestational Age

LMP:           19w 1d       Date:   01/11/16                 EDD:   10/17/16
U/S Today:     19w 5d                                        EDD:   10/13/16
Best:          19w 1d    Det. By:   LMP  (01/11/16)          EDD:   10/17/16
Anatomy

Cranium:               Appears normal         Aortic Arch:            Appears normal
Cavum:                 Appears normal         Ductal Arch:            Appears normal
Ventricles:            Appears normal         Diaphragm:              Appears normal
Choroid Plexus:        Appears normal         Stomach:                Appears normal, left
sided
Cerebellum:            Appears normal         Abdomen:                Appears normal
Posterior Fossa:       Appears normal         Abdominal Wall:         Appears nml (cord
insert, abd wall)
Nuchal Fold:           Appears normal         Cord Vessels:           Appears normal (3
vessel cord)
Face:                  Appears normal         Kidneys:                Appear normal
(orbits and profile)
Lips:                  Appears normal         Bladder:                Appears normal
Thoracic:              Appears normal         Spine:                  Appears normal
Heart:                 Appears normal         Upper Extremities:      Appears normal
(4CH, axis, and situs
RVOT:                  Appears normal         Lower Extremities:      Appears normal
LVOT:                  Appears normal

Other:  Fetus appears to be a male. Technically difficult due to fetal position.
Cervix Uterus Adnexa

Cervix
Length:           4.83  cm.
Normal appearance by transabdominal scan.

Uterus
No abnormality visualized.

Left Ovary
Not visualized.

Right Ovary
Not visualized.
Impression

Singleton intrauterine pregnancy at 19+1 weeks, here for
anatomic survey
Review of the anatomy shows no sonographic markers for
aneuploidy or structural anomalies
Amniotic fluid volume is normal
Estimated fetal weight is 283g which is growth in the 48th
percentile
Recommendations

Normal growth and development
Follow-up ultrasounds as clinically indicated.

## 2019-02-04 ENCOUNTER — Ambulatory Visit: Payer: HRSA Program | Attending: Internal Medicine

## 2019-02-04 DIAGNOSIS — Z20822 Contact with and (suspected) exposure to covid-19: Secondary | ICD-10-CM | POA: Diagnosis present

## 2019-02-05 LAB — NOVEL CORONAVIRUS, NAA: SARS-CoV-2, NAA: NOT DETECTED

## 2023-10-03 ENCOUNTER — Encounter (HOSPITAL_COMMUNITY): Payer: Self-pay

## 2023-10-03 ENCOUNTER — Emergency Department (HOSPITAL_COMMUNITY)
Admission: EM | Admit: 2023-10-03 | Discharge: 2023-10-03 | Disposition: A | Discharge status: Home / Self Care | Attending: Emergency Medicine | Admitting: Emergency Medicine

## 2023-10-03 ENCOUNTER — Other Ambulatory Visit: Payer: Self-pay

## 2023-10-03 ENCOUNTER — Emergency Department (HOSPITAL_COMMUNITY)
Admission: EM | Admit: 2023-10-03 | Discharge: 2023-10-04 | Disposition: A | Discharge status: Home / Self Care | Source: Home / Self Care | Attending: Emergency Medicine | Admitting: Emergency Medicine

## 2023-10-03 ENCOUNTER — Emergency Department (HOSPITAL_COMMUNITY)

## 2023-10-03 DIAGNOSIS — R519 Headache, unspecified: Secondary | ICD-10-CM | POA: Insufficient documentation

## 2023-10-03 DIAGNOSIS — Y92410 Unspecified street and highway as the place of occurrence of the external cause: Secondary | ICD-10-CM | POA: Insufficient documentation

## 2023-10-03 LAB — PREGNANCY, URINE: Preg Test, Ur: NEGATIVE

## 2023-10-03 MED ORDER — ACETAMINOPHEN 325 MG PO TABS
650.0000 mg | ORAL_TABLET | Freq: Once | ORAL | Status: AC
  Administered 2023-10-03: 650 mg via ORAL
  Filled 2023-10-03: qty 2

## 2023-10-03 MED ORDER — ONDANSETRON HCL 4 MG PO TABS
4.0000 mg | ORAL_TABLET | Freq: Once | ORAL | Status: AC
  Administered 2023-10-04: 4 mg via ORAL
  Filled 2023-10-03: qty 1

## 2023-10-03 MED ORDER — IBUPROFEN 400 MG PO TABS
600.0000 mg | ORAL_TABLET | Freq: Once | ORAL | Status: AC
  Administered 2023-10-04: 600 mg via ORAL
  Filled 2023-10-03: qty 1

## 2023-10-03 MED ORDER — ACETAMINOPHEN 500 MG PO TABS
1000.0000 mg | ORAL_TABLET | Freq: Once | ORAL | Status: AC
  Administered 2023-10-04: 1000 mg via ORAL
  Filled 2023-10-03: qty 2

## 2023-10-03 MED ORDER — ONDANSETRON 4 MG PO TBDP
8.0000 mg | ORAL_TABLET | Freq: Once | ORAL | Status: AC
  Administered 2023-10-03: 8 mg via ORAL
  Filled 2023-10-03: qty 2

## 2023-10-03 NOTE — ED Notes (Signed)
Pt name called to go back to see provider, no response.
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
## 2023-10-03 NOTE — ED Notes (Signed)
 Pt stated she wanted to leave while this tech was getting vitals. Pt deciding to stay at this time.

## 2023-10-03 NOTE — Discharge Instructions (Signed)
 Use Tylenol  and/or ibuprofen  as directed for your pain.  Return here for any problems
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
## 2023-10-04 LAB — CBG MONITORING, ED: Glucose-Capillary: 93 mg/dL (ref 70–99)

## 2023-10-20 ENCOUNTER — Ambulatory Visit
Admission: RE | Admit: 2023-10-20 | Discharge: 2023-10-20 | Disposition: A | Discharge status: Home / Self Care | Payer: Self-pay | Source: Ambulatory Visit | Attending: Surgery | Admitting: Surgery

## 2023-10-20 ENCOUNTER — Other Ambulatory Visit: Payer: Self-pay | Admitting: Surgery

## 2023-10-20 DIAGNOSIS — M542 Cervicalgia: Secondary | ICD-10-CM

## 2023-10-28 ENCOUNTER — Other Ambulatory Visit: Payer: Self-pay | Admitting: Surgery

## 2023-10-28 DIAGNOSIS — M542 Cervicalgia: Secondary | ICD-10-CM

## 2023-11-09 ENCOUNTER — Ambulatory Visit
Admission: RE | Admit: 2023-11-09 | Discharge: 2023-11-09 | Disposition: A | Discharge status: Home / Self Care | Payer: Self-pay | Source: Ambulatory Visit | Attending: Surgery | Admitting: Surgery

## 2023-11-09 DIAGNOSIS — M542 Cervicalgia: Secondary | ICD-10-CM
